# Patient Record
Sex: Female | Born: 1975 | Race: Black or African American | Hispanic: No | Marital: Single | Smoking: Never smoker
Health system: Southern US, Community
[De-identification: ages and names within clinical notes are randomized; demographics above are authoritative.]

## PROBLEM LIST (undated history)

## (undated) HISTORY — PX: TUBAL LIGATION: SHX77

---

## 2014-04-21 ENCOUNTER — Encounter (HOSPITAL_COMMUNITY): Payer: Self-pay | Admitting: *Deleted

## 2014-04-21 ENCOUNTER — Emergency Department (HOSPITAL_COMMUNITY)
Admission: EM | Admit: 2014-04-21 | Discharge: 2014-04-21 | Disposition: A | Discharge status: Home / Self Care | Payer: Self-pay | Attending: Emergency Medicine | Admitting: Emergency Medicine

## 2014-04-21 DIAGNOSIS — J069 Acute upper respiratory infection, unspecified: Secondary | ICD-10-CM | POA: Insufficient documentation

## 2014-04-21 DIAGNOSIS — H578 Other specified disorders of eye and adnexa: Secondary | ICD-10-CM | POA: Insufficient documentation

## 2014-04-21 DIAGNOSIS — H10021 Other mucopurulent conjunctivitis, right eye: Secondary | ICD-10-CM

## 2014-04-21 MED ORDER — ERYTHROMYCIN 5 MG/GM OP OINT: TOPICAL_OINTMENT | OPHTHALMIC | Status: DC

## 2014-04-21 NOTE — ED Notes (Signed)
Pt is here with irritation to right eye.  Pt reports continuous head cold and reports green sputum

## 2014-04-21 NOTE — ED Provider Notes (Signed)
CSN: [id number]     Arrival date & time 04/21/14  4098 History  This chart was scribed for non-physician practitioner, Mellody Drown, PA-C , working with Vida Roller, MD by Charline Bills, ED Scribe. This patient was seen in room TR05C/TR05C and the patient's care was started at 9:07 AM.   Chief Complaint  Patient presents with  . Eye Drainage  . Nasal Congestion   HPI Comments: Cassandra Yates is a 39 y.o. female who presents to the Emergency Department complaining of R eye discharge onset this morning. Pt states that she noted eye crusting, eye itching and redness this morning upon waking. She reports associated sore throat, congestion, cough, subjective fever since last week. Pt wears glasses. No known allergies.   The history is provided by the patient. No language interpreter was used.   History reviewed. No pertinent past medical history. Past Surgical History  Procedure Laterality Date  . Tubal ligation    . Cesarean section     No family history on file. History  Substance Use Topics  . Smoking status: Never Smoker   . Smokeless tobacco: Not on file  . Alcohol Use: No   OB History    No data available     Review of Systems  Constitutional: Negative for fever and chills.  HENT: Positive for congestion and sore throat. Negative for ear pain.   Eyes: Positive for discharge, redness and itching.  Respiratory: Positive for cough.    Allergies  Review of patient's allergies indicates no known allergies.  Home Medications   Prior to Admission medications   Not on File   Triage Vitals: BP 143/95 mmHg  Pulse 80  Temp(Src) 97.9 F (36.6 C) (Oral)  Resp 18  SpO2 100%  LMP 04/07/2014 Physical Exam  Constitutional: She is oriented to person, place, and time. She appears well-developed and well-nourished.  Non-toxic appearance. She does not have a sickly appearance. No distress.  HENT:  Head: Normocephalic and atraumatic.  Nose: Rhinorrhea present.  Mouth/Throat:  Uvula is midline and oropharynx is clear and moist. No trismus in the jaw. No oropharyngeal exudate, posterior oropharyngeal erythema or tonsillar abscesses.  Eyes: EOM are normal. Pupils are equal, round, and reactive to light. Right eye exhibits discharge. Right eye exhibits no chemosis, no exudate and no hordeolum. Left eye exhibits no chemosis, no discharge, no exudate and no hordeolum. No foreign body present in the left eye. Right conjunctiva is injected.  Minimal amount of clear drainage.  Neck: Neck supple.  Cardiovascular: Normal rate, regular rhythm and normal heart sounds.   Pulmonary/Chest: Effort normal and breath sounds normal. No respiratory distress. She has no decreased breath sounds. She has no wheezes. She has no rhonchi. She has no rales.  Musculoskeletal: Normal range of motion.  Lymphadenopathy:       Head (right side): No tonsillar adenopathy present.       Head (left side): No tonsillar adenopathy present.  Neurological: She is alert and oriented to person, place, and time.  Skin: Skin is warm and dry.  Psychiatric: She has a normal mood and affect. Her behavior is normal.  Nursing note and vitals reviewed.  ED Course  Procedures (including critical care time) DIAGNOSTIC STUDIES: Oxygen Saturation is 100% on RA, normal by my interpretation.    COORDINATION OF CARE: 9:12 AM-Discussed treatment with pt at bedside and pt agreed to plan.   Labs Review Labs Reviewed - No data to display  Imaging Review No results found.  EKG Interpretation None      MDM   Final diagnoses:  Pink eye disease of right eye  URI (upper respiratory infection)   Patient with upper respiratory infection and pinkeye plan to treat with antibiotics, follow-up with eye specialist as needed. Mucinex DM for upper respiratory symptoms.  Work note provided.  Meds given in ED:  Medications - No data to display  Discharge Medication List as of 04/21/2014  9:15 AM    START taking these  medications   Details  erythromycin ophthalmic ointment Place a 1/2 inch ribbon of ointment into the lower eyelid. 4 times a day for 5 days, Print       I personally performed the services described in this documentation, which was scribed in my presence. The recorded information has been reviewed and is accurate.    Mellody DrownLauren Aristidis Talerico, PA-C 04/22/14 2346  Flint MelterElliott L [place], MD 04/22/14 [id number]

## 2014-04-21 NOTE — Discharge Instructions (Signed)
Call for a follow up appointment with a Family or Primary Care Provider.  °Return if Symptoms worsen.   °Take medication as prescribed.  ° ° °Emergency Department Resource Guide °1) Find a Doctor and Pay Out of Pocket °Although you won't have to find out who is covered by your insurance plan, it is a good idea to ask around and get recommendations. You will then need to call the office and see if the doctor you have chosen will accept you as a new patient and what types of options they offer for patients who are self-pay. Some doctors offer discounts or will set up payment plans for their patients who do not have insurance, but you will need to ask so you aren't surprised when you get to your appointment. ° °2) Contact Your Local Health Department °Not all health departments have doctors that can see patients for sick visits, but many do, so it is worth a call to see if yours does. If you don't know where your local health department is, you can check in your phone book. The CDC also has a tool to help you locate your state's health department, and many state websites also have listings of all of their local health departments. ° °3) Find a Walk-in Clinic °If your illness is not likely to be very severe or complicated, you may want to try a walk in clinic. These are popping up all over the country in pharmacies, drugstores, and shopping centers. They're usually staffed by nurse practitioners or physician assistants that have been trained to treat common illnesses and complaints. They're usually fairly quick and inexpensive. However, if you have serious medical issues or chronic medical problems, these are probably not your best option. ° °No Primary Care Doctor: °- Call [facility] at  832-8000 - they can help you locate a primary care doctor that  accepts your insurance, provides certain services, etc. °- Physician Referral Service- 1-800-533-3463 ° °Chronic Pain Problems: °Organization         Address  Phone    Notes  °[facility]  (336) 297-2271 Patients need to be referred by their primary care doctor.  ° °Medication Assistance: °Organization         Address  Phone   Notes  °[place] [facility] [address] °[place], [place] [address] (336) 641-8030 --Must be a resident of [place] °-- Must have NO insurance coverage whatsoever (no Medicaid/ Medicare, etc.) °-- The pt. MUST have a primary care doctor that directs their care regularly and follows them in the community °  °[facility]  (866) 331-1348   °[employer]  (888) 892-1162   ° °Agencies that provide inexpensive medical care: °Organization         Address  Phone   Notes  °[facility]  (336) 832-8035   °[facility]    (336) 832-7272   °[facility] [address] °[place], [place] [address] (336) 832-4777   °[facility] [address], °[place] (336) 271-4999   °[facility]    (336) 373-0678   °[facility]    (336) 272-1050   °[facility] ° [address], [place] Phone:  (336) 832-4444, Fax:  (336) 832-4440 Hours of Operation:  9 am - 6 pm, M-F.  Also accepts Medicaid/Medicare and self-pay.  °[facility] ° [address],    Phone: (336) 832-3150, Fax: (336) 832-3151. Hours of Operation:  8:30 am - 5:30 pm, M-F.  Also accepts Medicaid and self-pay.  °[facility] [place] [address], [place] Phone: (336) 878-6027   °[facility] [address], [place], [place] (336)723-1848, Ext. 123 Mondays & Thursdays: 7-9 AM.  First 15 patients are seen on a first come, first serve basis. °  ° °Medicaid-accepting [place] Providers: ° °Organization         Address  Phone   Notes  °[facility] [address], [place] (336) 641-2100 Also accepts self-pay patients.  °[facility]  [address], [place] ° (336) 856-9996   °[facility] [address], [place] (336) 288-8857   °[facility] [address] [place] [address], [place] (336) 299-7000   °Veita Bland [address], [place]  ° (336) 373-1557 Only accepts [place] Access Medicaid patients after they have their name applied to their card.  ° °Self-Pay (no insurance) in [place]: ° °Organization         Address  Phone   Notes  °Sickle Cell Patients, [facility] [address], [place] (336) 832-1970   °[facility] [address], [place] (336) 832-4400   °[facility] ° [address], [place] (336) 992-4800   °[facility]/Dr. Osei-Bonsu ° [address] [place] [address], [place] or [address], [place] (336) 841-8500 Phone number for both [place] and [place] locations is the same.  °[facility] [address], [place] (336) 299-0000   °[facility] [address] [place] [address], [place] or [address] (336) 852-7530 °(336) 878-2260   °[facility] [address], [place] (336) 350-1642, phone; (336) 294-5005, fax Sees patients 1st and 3rd Saturday of every month.  Must not qualify for public or private insurance (i.e. Medicaid, Medicare, [place] Health Choice, Veterans' Benefits) • Household income should be no more than 200% of the poverty level •The clinic cannot treat you if you are pregnant or think you are pregnant • Sexually transmitted diseases are not treated at the clinic.  ° ° °Dental Care: °Organization         Address  Phone  Notes  °[facility] [address],  (336) 641-6152 Accepts children up to age 21 who are enrolled in Medicaid or [place] Health Choice; pregnant women with a Medicaid card; and children who have  applied for Medicaid or [place] Health Choice, but were declined, whose parents can pay a reduced fee at time of service.  °[facility] [place]  [address], [place] (336) 641-7733 Accepts children up to age 21 who are enrolled in Medicaid or  Health Choice; pregnant women with a Medicaid card; and children who have applied for Medicaid or  Health Choice, but were declined, whose parents can pay a reduced fee at time of service.  °[facility] ° [address],  (336) 641-4533 Patients are seen by appointment only. Walk-ins are not accepted. [facility] will see patients 18 years of age and older. °Monday - Tuesday (8am-5pm) °Most Wednesdays (8:30-5pm) °$30 per visit, cash only  °[facility] ° [address], [place] (336) 641-4533 Patients are seen by appointment only. Walk-ins are not accepted. [facility] will see patients 18 years of age and older. °One Wednesday Evening (Monthly: Volunteer Based).  $30 per visit, cash only  °[facility]  (919) 537-3737 for adults; Children under age 4, call Graduate Pediatric Dentistry at ([phone]) [phone]. Children aged 4-14, please call (919) [phone] to request a pediatric application. ° Dental services are provided in all areas of dental care including fillings, crowns and bridges, complete and partial dentures, implants, gum treatment, root canals, and extractions. Preventive care is also provided. Treatment is provided to both adults and children. °Patients are selected via a lottery and there is often a waiting list. °  °[facility] [address], °[place] ° (336) 763-8833 [email or web address] °  °[facility] [address], [place], [place] (336)723-1848, Ext. 123 Second and Fourth Thursday of each month, opens at 6:30 AM; Clinic ends at 9 AM.  Patients are seen on a first-come first-served basis, and a  limited number are seen during each clinic.  ° °[facility] ° [address], [place], [place] (336) 723-7904   Eligibility Requirements °You must have lived in [place], [place], or [place] counties for at least the last three months. °  You cannot be eligible for state or federal sponsored healthcare insurance, including Veterans Administration, Medicaid, or Medicare. °  You generally cannot be eligible for healthcare insurance through your employer.  °  How to apply: °Eligibility screenings are held every Tuesday and Wednesday afternoon from 1:00 pm until 4:00 pm. You do not need an appointment for the interview!  °[facility] [address], [place], [place] 336-631-2330   °[facility]  336-342-8273   °[facility]  336-703-3100   °[facility]  336-570-6415   ° °Behavioral Health Resources in the Community: °Intensive Outpatient Programs °Organization         Address  Phone  Notes  °[facility] [address], [place], [place] 336-878-6098   °[facility] [address], [place], [place] 336-832-9800   °[facility] [address], [place], [place] ° 336-882-2125   °[facility] [address],  °[place], [place] 1-800-853-5163 or 336-641-4981   °Substance Abuse Resources °Organization         Address  Phone  Notes  °[facility]  336-882-2125   °[facility]  336-784-9470   °[facility]  336-285-9073   °[facility]  336-845-3988   °Residential & Outpatient Substance Abuse Program  1-800-659-3381   °Psychological Services °Organization         Address  Phone  Notes  °[facility]  336- 832-9600   °[facility]  336- 378-7881   °[facility] [address], [place] 1-800-853-5163 or 336-641-4981   ° °Mobile Crisis Teams °Organization          Address  Phone  Notes  °[facility], Mobile Crisis Care Unit  1-877-626-1772   °Assertive °[facility] ° [address] [place], [place] 336-834-9664   °Sharon DeEsch [address] °[place] 336-554-5454   ° °Self-Help/Support Groups °Organization         Address  Phone             Notes  °[facility]  - variety of   support groups  336- 373-1402 Call for more information  °Narcotics Anonymous (NA), [facility] [address], °[place]  2 meetings at this location  ° °Residential Treatment Programs °Organization         Address  Phone  Notes  °[facility] [address],    °[place]  1-866-801-8205   °[facility] ° [address], [place], [place] 704-293-8524   °[facility] [address], [place] 336-845-3988 Admissions: 8am-3pm M-F  °[facility] [address]    °[place], [place] 336-841-1104   °[facility] [address], [place], [place] 336-379-7146   °[facility] [address]  °[place], [place] 336-285-9073   °[facility] Intensive Outpatient [address], [place], [place] 336-852-3033   °[facility] [address]  °[place], [place] 1-877-615-2722 or 336-784-9470   °[facility] [address] [place], [place] 336-227-7417 Accepts Medicaid  °[facility] [address]  °[place] 1-800-659-3381 Substance Abuse/Addiction Treatment  ° °[place] Behavioral Health Resources °Organization         Address  Phone  Notes  °[facility]  (888) 581-9988   °Julie Brannon, PhD [address] [place], [place]   (336) 349-5553 or (336) 951-0000   °[facility]   [address] °[place], [place] (336) 349-4454   °[facility] [address], [place], [place] (336) 342-8316 Insurance/Medicaid/sponsorship  through [facility]  °[facility] [address]                                    [place], [place] (336) 342-8316 Therapy/tele-psych/case  °[facility] [address]  ° [place], [place] (336) 349-2233    °Dr. Arfeen  (336) 349-4544   °[facility] [place]  [employer] [place] [facility] 1) [address], [place] °2) [address], [place] °3)  [address], [place] (336) 349-3220 °(336) 342-7768 ° °(336) 342-8140   °[place] [facility] (336) 342-1394 or (336) 342-3537 (After Hours)    ° °

## 2015-05-10 ENCOUNTER — Emergency Department (HOSPITAL_COMMUNITY): Payer: No Typology Code available for payment source

## 2015-05-10 ENCOUNTER — Emergency Department (HOSPITAL_COMMUNITY)
Admission: EM | Admit: 2015-05-10 | Discharge: 2015-05-10 | Disposition: A | Discharge status: Home / Self Care | Payer: No Typology Code available for payment source | Attending: Emergency Medicine | Admitting: Emergency Medicine

## 2015-05-10 ENCOUNTER — Encounter (HOSPITAL_COMMUNITY): Payer: Self-pay | Admitting: *Deleted

## 2015-05-10 DIAGNOSIS — M545 Low back pain, unspecified: Secondary | ICD-10-CM

## 2015-05-10 DIAGNOSIS — S3992XA Unspecified injury of lower back, initial encounter: Secondary | ICD-10-CM | POA: Diagnosis not present

## 2015-05-10 DIAGNOSIS — S29001A Unspecified injury of muscle and tendon of front wall of thorax, initial encounter: Secondary | ICD-10-CM | POA: Insufficient documentation

## 2015-05-10 DIAGNOSIS — S199XXA Unspecified injury of neck, initial encounter: Secondary | ICD-10-CM | POA: Diagnosis not present

## 2015-05-10 DIAGNOSIS — Y998 Other external cause status: Secondary | ICD-10-CM | POA: Diagnosis not present

## 2015-05-10 DIAGNOSIS — Y9389 Activity, other specified: Secondary | ICD-10-CM | POA: Diagnosis not present

## 2015-05-10 DIAGNOSIS — M542 Cervicalgia: Secondary | ICD-10-CM

## 2015-05-10 DIAGNOSIS — Y92410 Unspecified street and highway as the place of occurrence of the external cause: Secondary | ICD-10-CM | POA: Diagnosis not present

## 2015-05-10 DIAGNOSIS — S3991XA Unspecified injury of abdomen, initial encounter: Secondary | ICD-10-CM | POA: Insufficient documentation

## 2015-05-10 MED ORDER — IBUPROFEN 800 MG PO TABS: 800.0000 mg | ORAL_TABLET | Freq: Three times a day (TID) | ORAL | Status: DC

## 2015-05-10 MED ORDER — IBUPROFEN 800 MG PO TABS
800.0000 mg | ORAL_TABLET | Freq: Once | ORAL | Status: AC
  Administered 2015-05-10: 800 mg via ORAL
  Filled 2015-05-10: qty 1

## 2015-05-10 MED ORDER — METHOCARBAMOL 500 MG PO TABS: 500.0000 mg | ORAL_TABLET | Freq: Two times a day (BID) | ORAL | Status: DC

## 2015-05-10 MED ORDER — TETANUS-DIPHTH-ACELL PERTUSSIS 5-2.5-18.5 LF-MCG/0.5 IM SUSP: 0.5000 mL | Freq: Once | INTRAMUSCULAR | Status: DC

## 2015-05-10 MED ORDER — METHOCARBAMOL 500 MG PO TABS
500.0000 mg | ORAL_TABLET | Freq: Once | ORAL | Status: AC
  Administered 2015-05-10: 500 mg via ORAL
  Filled 2015-05-10: qty 1
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
## 2015-05-10 NOTE — Discharge Instructions (Signed)
1. Medications: robaxin, ibuprofen, usual home medications °2. Treatment: rest, drink plenty of fluids, gentle stretching as discussed, alternate ice and heat °3. Follow Up: Please followup with your primary doctor in 3 days for discussion of your diagnoses and further evaluation after today's visit; if you do not have a primary care doctor use the resource guide provided to find one;  Return to the ER for worsening back pain, difficulty walking, loss of bowel or bladder control or other concerning symptoms ° ° ° °Back Exercises °The following exercises strengthen the muscles that help to support the back. They also help to keep the lower back flexible. Doing these exercises can help to prevent back pain or lessen existing pain. °If you have back pain or discomfort, try doing these exercises 2-3 times each day or as told by your health care provider. When the pain goes away, do them once each day, but increase the number of times that you repeat the steps for each exercise (do more repetitions). If you do not have back pain or discomfort, do these exercises once each day or as told by your health care provider. °EXERCISES °Single Knee to Chest °Repeat these steps 3-5 times for each leg: °1. Lie on your back on a firm bed or the floor with your legs extended. °2. Bring one knee to your chest. Your other leg should stay extended and in contact with the floor. °3. Hold your knee in place by grabbing your knee or thigh. °4. Pull on your knee until you feel a gentle stretch in your lower back. °5. Hold the stretch for 10-30 seconds. °6. Slowly release and straighten your leg. °Pelvic Tilt °Repeat these steps 5-10 times: °1. Lie on your back on a firm bed or the floor with your legs extended. °2. Bend your knees so they are pointing toward the ceiling and your feet are flat on the floor. °3. Tighten your lower abdominal muscles to press your lower back against the floor. This motion will tilt your pelvis so your tailbone  points up toward the ceiling instead of pointing to your feet or the floor. °4. With gentle tension and even breathing, hold this position for 5-10 seconds. °Cat-Cow °Repeat these steps until your lower back becomes more flexible: °1. Get into a hands-and-knees position on a firm surface. Keep your hands under your shoulders, and keep your knees under your hips. You may place padding under your knees for comfort. °2. Let your head hang down, and point your tailbone toward the floor so your lower back becomes rounded like the back of a cat. °3. Hold this position for 5 seconds. °4. Slowly lift your head and point your tailbone up toward the ceiling so your back forms a sagging arch like the back of a cow. °5. Hold this position for 5 seconds. °Press-Ups °Repeat these steps 5-10 times: °1. Lie on your abdomen (face-down) on the floor. °2. Place your palms near your head, about shoulder-width apart. °3. While you keep your back as relaxed as possible and keep your hips on the floor, slowly straighten your arms to raise the top half of your body and lift your shoulders. Do not use your back muscles to raise your upper torso. You may adjust the placement of your hands to make yourself more comfortable. °4. Hold this position for 5 seconds while you keep your back relaxed. °5. Slowly return to lying flat on the floor. °Bridges °Repeat these steps 10 times: °1. Lie on your back   on a firm surface. °2. Bend your knees so they are pointing toward the ceiling and your feet are flat on the floor. °3. Tighten your buttocks muscles and lift your buttocks off of the floor until your waist is at almost the same height as your knees. You should feel the muscles working in your buttocks and the back of your thighs. If you do not feel these muscles, slide your feet 1-2 inches farther away from your buttocks. °4. Hold this position for 3-5 seconds. °5. Slowly lower your hips to the starting position, and allow your buttocks muscles to  relax completely. °If this exercise is too easy, try doing it with your arms crossed over your chest. °Abdominal Crunches °Repeat these steps 5-10 times: °1. Lie on your back on a firm bed or the floor with your legs extended. °2. Bend your knees so they are pointing toward the ceiling and your feet are flat on the floor. °3. Cross your arms over your chest. °4. Tip your chin slightly toward your chest without bending your neck. °5. Tighten your abdominal muscles and slowly raise your trunk (torso) high enough to lift your shoulder blades a tiny bit off of the floor. Avoid raising your torso higher than that, because it can put too much stress on your low back and it does not help to strengthen your abdominal muscles. °6. Slowly return to your starting position. °Back Lifts °Repeat these steps 5-10 times: °1. Lie on your abdomen (face-down) with your arms at your sides, and rest your forehead on the floor. °2. Tighten the muscles in your legs and your buttocks. °3. Slowly lift your chest off of the floor while you keep your hips pressed to the floor. Keep the back of your head in line with the curve in your back. Your eyes should be looking at the floor. °4. Hold this position for 3-5 seconds. °5. Slowly return to your starting position. °SEEK MEDICAL CARE IF: °· Your back pain or discomfort gets much worse when you do an exercise. °· Your back pain or discomfort does not lessen within 2 hours after you exercise. °If you have any of these problems, stop doing these exercises right away. Do not do them again unless your health care provider says that you can. °SEEK IMMEDIATE MEDICAL CARE IF: °· You develop sudden, severe back pain. If this happens, stop doing the exercises right away. Do not do them again unless your health care provider says that you can. °  °This information is not intended to replace advice given to you by your health care provider. Make sure you discuss any questions you have with your health  care provider. °  °Document Released: 04/28/2004 Document Revised: 12/10/2014 Document Reviewed: 05/15/2014 °Elsevier Interactive Patient Education ©2016 [employer] Inc[employer] ° ° ° °Emergency Department Resource Guide °1) Find a Doctor and Pay Out of Pocket °Although you won't have to find out who is covered by your insurance plan, it is a good idea to ask around and get recommendations. You will then need to call the office and see if the doctor you have chosen will accept you as a new patient and what types of options they offer for patients who are self-pay. Some doctors offer discounts or will set up payment plans for their patients who do not have insurance, but you will need to ask so you aren't surprised when you get to your appointment. ° °2) Contact Your Local Health Department °Not all health departments have doctors   that can see patients for sick visits, but many do, so it is worth a call to see if yours does. If you don't know where your local health department is, you can check in your phone book. The CDC also has a tool to help you locate your state's health department, and many state websites also have listings of all of their local health departments. ° °3) Find a Walk-in Clinic °If your illness is not likely to be very severe or complicated, you may want to try a walk in clinic. These are popping up all over the country in pharmacies, drugstores, and shopping centers. They're usually staffed by nurse practitioners or physician assistants that have been trained to treat common illnesses and complaints. They're usually fairly quick and inexpensive. However, if you have serious medical issues or chronic medical problems, these are probably not your best option. ° °No Primary Care Doctor: °- Call [facility] at  832-8000 - they can help you locate a primary care doctor that  accepts your insurance, provides certain services, etc. °- Physician Referral Service- 1-800-533-3463 ° °Chronic Pain  Problems: °Organization         Address  Phone   Notes  °[facility]  (336) 297-2271 Patients need to be referred by their primary care doctor.  ° °Medication Assistance: °Organization         Address  Phone   Notes  °[place] [facility] [address] °[place], [place] [address] (336) 641-8030 --Must be a resident of [place] °-- Must have NO insurance coverage whatsoever (no Medicaid/ Medicare, etc.) °-- The pt. MUST have a primary care doctor that directs their care regularly and follows them in the community °  °[facility]  (866) 331-1348   °[employer]  (888) 892-1162   ° °Agencies that provide inexpensive medical care: °Organization         Address  Phone   Notes  °[facility]  (336) 832-8035   °[facility]    (336) 832-7272   °[facility] [address] °[place], [place] [address] (336) 832-4777   °[facility] [address], °[place] (336) 271-4999   °[facility]    (336) 373-0678   °[facility]    (336) 272-1050   °[facility] ° [address], [place] Phone:  (336) 832-4444, Fax:  (336) 832-4440 Hours of Operation:  9 am - 6 pm, M-F.  Also accepts Medicaid/Medicare and self-pay.  °[facility] ° [address], [place] Phone: (336) [phone], Fax: (336) [phone]. Hours of Operation:  8:30 am - 5:30 pm, M-F.  Also accepts Medicaid and self-pay.  °[facility] [place] [address], [place] Phone: (336) 878-6027   °[facility] [address], [place], [place] (336)723-1848, Ext. 123 Mondays & Thursdays: 7-9 AM.  First 15 patients are seen on a first come, first serve basis. °  ° °Medicaid-accepting [place] Providers: ° °Organization         Address  Phone   Notes  °[facility] [address], [place] (336) 641-2100 Also  accepts self-pay patients.  °[facility] [address], [place] ° (336) 856-9996   °[facility] [address],  (336) 288-8857   °[facility] [address] [place] [address], [place] (336) 299-7000   °Veita Bland [address], [place]  ° (336) 373-1557 Only accepts [place] Access Medicaid patients after they have their name applied to their card.  ° °Self-Pay (no insurance) in [place]: ° °Organization         Address  Phone   Notes  °Sickle Cell Patients, [facility] [address], [place] (336) 832-1970   °[facility] [address], [place] (336) 832-4400   °[facility] ° [address], [place] (336) 992-4800   °[facility]/Dr. Osei-Bonsu ° [address] [place] [address], [place] or [address], [place] (336) [phone] Phone number for both [place] and [place] locations is the same.  °[facility] [address], [place] (336) 299-0000   °[facility] [address] [place] [address], [place] or [address] (336) 852-7530 °(336) 878-2260   °[facility] [address], [place] (336) 350-1642, phone; (336) 294-5005, fax Sees patients 1st and 3rd Saturday of every month.  Must not qualify for public or private insurance (i.e. Medicaid, Medicare, [place] Health Choice, Veterans' Benefits) • Household income should be no more than 200% of the poverty level •The clinic cannot treat you if you are pregnant or think you are pregnant • Sexually transmitted diseases are not treated at the clinic.  ° ° °Dental Care: °Organization         Address  Phone  Notes  °[facility] [address], [place] (336) 641-6152 Accepts children up to age 21 who are enrolled in Medicaid or [place] Health Choice; pregnant  women with a Medicaid card; and children who have applied for Medicaid or [place] Health Choice, but were declined, whose parents can pay a reduced fee at time of service.  °[facility] [place]  [address], [place] (336) 641-7733 Accepts children up to age 21 who are enrolled in Medicaid or [place] Health Choice; pregnant women with a Medicaid card; and children who have applied for Medicaid or  Health Choice, but were declined, whose parents can pay a reduced fee at time of service.  °[facility] ° [address], [place] (336) 641-4533 Patients are seen by appointment only. Walk-ins are not accepted. [facility] will see patients 18 years of age and older. °Monday - Tuesday (8am-5pm) °Most Wednesdays (8:30-5pm) °$30 per visit, cash only  °[facility] ° [address], [place] (336) 641-4533 Patients are seen by appointment only. Walk-ins are not accepted. [facility] will see patients 18 years of age and older. °One Wednesday Evening (Monthly: Volunteer Based).  $30 per visit, cash only  °[facility]  (919) [phone] for adults; Children under age 4, call Graduate Pediatric Dentistry at ([phone]) [phone]. Children aged 4-14, please call ([phone]) [phone] to request a pediatric application. ° Dental services are provided in all areas of dental care including fillings, crowns and bridges, complete and partial dentures, implants, gum treatment, root canals, and extractions. Preventive care is also provided. Treatment is provided to both adults and children. °Patients are selected via a lottery and there is often a waiting list. °  °[facility] [address], °[place] ° (336) 763-8833 [email or web address] °  °[facility] [address], [place], [place] (336)723-1848, Ext. 123 Second and Fourth Thursday of each month, opens at 6:30 AM; Clinic ends at 9 AM.  Patients are  seen on a first-come first-served basis, and a limited number are seen during each clinic.  ° °[facility] ° [address], [place], [place] (336) 723-7904   Eligibility Requirements °You must have lived in [place], [place], or [place] counties for at least the last three months. °  You cannot be eligible for state or federal sponsored healthcare insurance, including Veterans Administration, Medicaid, or Medicare. °  You generally cannot be eligible for healthcare insurance through your employer.  °  How to apply: °Eligibility screenings are held every Tuesday and Wednesday afternoon from 1:00 pm until 4:00 pm. You do not need an appointment for the interview!  °[facility] [address], [place], [place] 336-631-2330   °[facility]  336-342-8273   °[facility]  336-703-3100   °[facility]  336-570-6415   ° °Behavioral Health Resources in the Community: °Intensive Outpatient Programs °Organization         Address  Phone  Notes  °[facility] [address], [place], [place] 336-878-6098   °[facility] [address], [place], [place] 336-832-9800   °[facility] [address], [place], [place] ° 336-882-2125   °[facility] [address],  °[place], [place] 1-800-853-5163 or 336-641-4981   °Substance Abuse Resources °Organization         Address  Phone  Notes  °[facility]  336-882-2125   °[facility]  336-784-9470   °[facility]  336-285-9073   °[facility]  336-845-3988   °Residential & Outpatient Substance Abuse Program  1-800-659-3381   °Psychological Services °Organization         Address  Phone  Notes  °[facility]  336- 832-9600   °[facility]  336- 378-7881   °[facility] [address], [place] 1-800-853-5163 or 336-641-4981   ° °Mobile Crisis  Teams °Organization         Address  Phone  Notes  °[facility], Mobile Crisis Care Unit  1-877-626-1772   °Assertive °[facility] ° [address] [place], [place] 336-834-9664   °Sharon DeEsch [address] °[place] 336-554-5454   ° °Self-Help/Support Groups °Organization         Address  Phone             Notes  °[facility] [place] - variety of support groups  336- 373-1402 Call for more information  °Narcotics Anonymous (NA), [facility] [address], °[place]  2 meetings at this location  ° °Residential Treatment Programs °Organization         Address  Phone  Notes  °[facility] [address],    °[place]  1-866-801-8205   °[facility] ° [address], [place], [place] 704-293-8524   °[facility] [address], [place] 336-845-3988 Admissions: 8am-3pm M-F  °[facility] [address]    °[place], [place] 336-841-1104   °[facility] [address], [place], [place] 336-379-7146   °[facility] [address]  °[place], [place] 336-285-9073   °[facility] Intensive Outpatient [address], [place],  336-852-3033   °  [facility] [address]  °[place], [place] 1-877-615-2722 or 336-784-9470   °[facility] [address] [place], [place] 336-227-7417 Accepts Medicaid  °[facility] [address]  °[place] 1-800-659-3381 Substance Abuse/Addiction Treatment  ° °[place] Behavioral Health Resources °Organization         Address  Phone  Notes  °[facility]  (888) 581-9988   °Julie Brannon, PhD [address] [place], [place]   (336) 349-5553 or (336) 951-0000   °[facility]   [address] °[place], [place] (336) 349-4454   °[facility] [address], [place], [place] (336) 342-8316  Insurance/Medicaid/sponsorship through [facility]  °[facility] [address]                                    [place], [place] (336) 342-8316 Therapy/tele-psych/case  °[facility] [address]  ° [place], [place] (336) 349-2233    °Dr. Arfeen  (336) 349-4544   °[facility] [place]  [employer] [place] [facility] 1) [address], [place] °2) [address], [place] °3)  [address], [place] (336) 349-3220 °(336) 342-7768 ° °(336) 342-8140   °[place] [facility] (336) 342-1394 or (336) 342-3537 (After Hours)    ° ° ° ° °

## 2015-05-10 NOTE — ED Notes (Signed)
Patient transported to X-ray 
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
## 2017-05-16 DIAGNOSIS — Z124 Encounter for screening for malignant neoplasm of cervix: Secondary | ICD-10-CM | POA: Diagnosis not present

## 2017-05-16 DIAGNOSIS — Z83438 Family history of other disorder of lipoprotein metabolism and other lipidemia: Secondary | ICD-10-CM | POA: Diagnosis not present

## 2017-05-16 DIAGNOSIS — Z8349 Family history of other endocrine, nutritional and metabolic diseases: Secondary | ICD-10-CM | POA: Diagnosis not present

## 2017-05-16 DIAGNOSIS — Z113 Encounter for screening for infections with a predominantly sexual mode of transmission: Secondary | ICD-10-CM | POA: Diagnosis not present

## 2017-05-16 DIAGNOSIS — Z01419 Encounter for gynecological examination (general) (routine) without abnormal findings: Secondary | ICD-10-CM | POA: Diagnosis not present

## 2018-03-26 DIAGNOSIS — B373 Candidiasis of vulva and vagina: Secondary | ICD-10-CM | POA: Diagnosis not present

## 2018-03-26 DIAGNOSIS — D251 Intramural leiomyoma of uterus: Secondary | ICD-10-CM | POA: Diagnosis not present

## 2018-04-14 ENCOUNTER — Encounter: Payer: Self-pay | Admitting: Osteopathic Medicine

## 2018-04-14 ENCOUNTER — Other Ambulatory Visit: Payer: Self-pay

## 2018-04-14 ENCOUNTER — Ambulatory Visit (INDEPENDENT_AMBULATORY_CARE_PROVIDER_SITE_OTHER): Payer: BLUE CROSS/BLUE SHIELD | Admitting: Osteopathic Medicine

## 2018-04-14 VITALS — BP 145/88 | HR 77 | Temp 98.5°F | Ht 66.0 in | Wt 198.6 lb

## 2018-04-14 DIAGNOSIS — R030 Elevated blood-pressure reading, without diagnosis of hypertension: Secondary | ICD-10-CM

## 2018-04-14 DIAGNOSIS — J069 Acute upper respiratory infection, unspecified: Secondary | ICD-10-CM | POA: Diagnosis not present

## 2018-04-14 DIAGNOSIS — B9789 Other viral agents as the cause of diseases classified elsewhere: Secondary | ICD-10-CM

## 2018-04-14 NOTE — Progress Notes (Signed)
HPI: Cassandra Yates is a 43 y.o. female who  has no past medical history on file.  she presents to [facility] today, 04/14/18,  for chief complaint of: Chief Complaint  Patient presents with  . Cough    being of this week   . Generalized Body Aches    . Context: thinks got sick form someone at work, daughter was also sick w/ similar symptoms.   . Location/Quality: chest, sinuses. Congestion and cough.  . Severity: getting better today, had to miss work past 3 days and needs a note.  . Duration: 6 days total        Past medical, surgical, social and family history reviewed:  There are no active problems to display for this patient.   Past Surgical History:  Procedure Laterality Date  . CESAREAN SECTION    . TUBAL LIGATION      Social History   Tobacco Use  . Smoking status: Never Smoker  . Smokeless tobacco: Never Used  Substance Use Topics  . Alcohol use: No    No family history on file.   Current medication list and allergy/intolerance information reviewed:    No current outpatient medications on file.   No current facility-administered medications for this visit.     No Known Allergies    Review of Systems:  Constitutional:  No  fever, no chills, +recent illness, No unintentional weight changes. No significant fatigue.   HEENT: No  headache, no vision change, no hearing change, No sore throat, +sinus pressure  Cardiac: No  chest pain, No  pressure, No palpitations  Respiratory:  No  shortness of breath. +Cough  Gastrointestinal: No  abdominal pain, No  nausea, No  vomiting,  No  blood in stool, No  diarrhea  Musculoskeletal: No new myalgia/arthralgia  Skin: No  Rash  Neurologic: No  weakness, No  dizziness  Exam:  BP (!) 145/88 (BP Location: Right Arm, Patient Position: Sitting, Cuff Size: Normal)   Pulse 77   Temp 98.5 F (36.9 C) (Oral)   Ht 5\' 6"  (1.676 m)   Wt 198 lb 9.6 oz (90.1 kg)   SpO2 97%   BMI 32.05  kg/m   Constitutional: VS see above. General Appearance: alert, well-developed, well-nourished, NAD  Eyes: Normal lids and conjunctive, non-icteric sclera  Ears, Nose, Mouth, Throat: MMM, Normal external inspection ears/nares/mouth/lips/gums. TM normal bilaterally. Pharynx/tonsils no erythema, no exudate. Nasal mucosa normal.   Neck: No masses, trachea midline. No tenderness/mass appreciated. No lymphadenopathy  Respiratory: Normal respiratory effort. no wheeze, no rhonchi, no rales  Cardiovascular: S1/S2 normal, no murmur, no rub/gallop auscultated. RRR. No lower extremity edema.   Musculoskeletal: Gait normal.   Neurological: Normal balance/coordination. No tremor.   Skin: warm, dry, intact.   Psychiatric: Normal judgment/insight. Normal mood and affect.       ASSESSMENT/PLAN:   Viral URI with cough - overall improving, would expect resolution. Work note provided...   Borderline blood pressure - would recommend establish here with primary doctor to recheck (I am a prn provider at this location)     Patient Instructions   Medications & Home Remedies for Upper Respiratory Illness   Note: the following list assumes no pregnancy, normal liver & kidney function and no other drug interactions. Dr. Lyn Hollingshead has highlighted medications which are safe for you to use, but these may not be appropriate for everyone. Always ask a pharmacist or qualified medical provider if you have any questions!  Aches/Pains, Fever, Headache OTC Acetaminophen (Tylenol) 500 mg tablets - take max 2 tablets (1000 mg) every 6 hours (4 times per day)  OTC Ibuprofen (Motrin) 200 mg tablets - take max 4 tablets (800 mg) every 6 hours*   Sinus Congestion Prescription Atrovent as directed OTC Nasal Saline if desired to rinse OTC Oxymetolazone (Afrin, others) sparing use due to rebound congestion, NEVER use in kids OTC Phenylephrine (Sudafed) 10 mg tablets every 4 hours (or the 12-hour  formulation)* OTC Diphenhydramine (Benadryl) 25 mg tablets - take max 2 tablets every 4 hours   Cough & Sore Throat Prescription cough pills or syrups as directed OTC Dextromethorphan (Robitussin, others) - cough suppressant OTC Guaifenesin (Robitussin, Mucinex, others) - expectorant (helps cough up mucus) (Dextromethorphan and Guaifenesin also come in a combination tablet/syrup) OTC Lozenges w/ Benzocaine + Menthol (Cepacol) Honey - as much as you want! Teas which "coat the throat" - look for ingredients Elm Bark, Licorice Root, Marshmallow Root   Other Prescription Oral Steroids to decrease inflammation and improve energy Prescription Antibiotics if these are necessary for bacterial infection - take ALL, even if you're feeling better  OTC Zinc Lozenges within 24 hours of symptoms onset - mixed evidence this shortens the duration of the common cold Don't waste your money on Vitamin C or Echinacea in acute illness - it's already too late!    *Caution in patients with high blood pressure       If you have lab work done today you will be contacted with your lab results within the next 2 weeks.  If you have not heard from us then please contact us. The fastest way to get your results is to register for My Chart.   IF you received an x-ray today, you will receive an invoice from [facility]. Please contact [facility] at [phone] with questions or concerns regarding your invoice.   IF you received labwork today, you will receive an invoice from [facility]. Please contact [facility] at [phone] with questions or concerns regarding your invoice.   Our billing staff will not be able to assist you with questions regarding bills from these companies.  You will be contacted with the lab results as soon as they are available. The fastest way to get your results is to activate your My Chart account. Instructions are located on the last page of this paperwork. If you have  not heard from us regarding the results in 2 weeks, please contact this office.         Visit summary with medication list and pertinent instructions was printed for patient to review. All questions at time of visit were answered - patient instructed to contact office with any additional concerns. ER/RTC precautions were reviewed with the patient.   Follow-up plan: Return if symptoms worsen or fail to improve.    Please note: voice recognition software was used to produce this document, and typos may escape review. Please contact Dr. Lyn HollingsheadAlexander for any needed clarifications.

## 2018-04-14 NOTE — Patient Instructions (Addendum)
Medications & Home Remedies for Upper Respiratory Illness   Note: the following list assumes no pregnancy, normal liver & kidney function and no other drug interactions. Dr. Alexander has highlighted medications which are safe for you to use, but these may not be appropriate for everyone. Always ask a pharmacist or qualified medical provider if you have any questions!    Aches/Pains, Fever, Headache OTC Acetaminophen (Tylenol) 500 mg tablets - take max 2 tablets (1000 mg) every 6 hours (4 times per day)  OTC Ibuprofen (Motrin) 200 mg tablets - take max 4 tablets (800 mg) every 6 hours*   Sinus Congestion Prescription Atrovent as directed OTC Nasal Saline if desired to rinse OTC Oxymetolazone (Afrin, others) sparing use due to rebound congestion, NEVER use in kids OTC Phenylephrine (Sudafed) 10 mg tablets every 4 hours (or the 12-hour formulation)* OTC Diphenhydramine (Benadryl) 25 mg tablets - take max 2 tablets every 4 hours   Cough & Sore Throat Prescription cough pills or syrups as directed OTC Dextromethorphan (Robitussin, others) - cough suppressant OTC Guaifenesin (Robitussin, Mucinex, others) - expectorant (helps cough up mucus) (Dextromethorphan and Guaifenesin also come in a combination tablet/syrup) OTC Lozenges w/ Benzocaine + Menthol (Cepacol) Honey - as much as you want! Teas which "coat the throat" - look for ingredients Elm Bark, Licorice Root, Marshmallow Root   Other Prescription Oral Steroids to decrease inflammation and improve energy Prescription Antibiotics if these are necessary for bacterial infection - take ALL, even if you're feeling better  OTC Zinc Lozenges within 24 hours of symptoms onset - mixed evidence this shortens the duration of the common cold Don't waste your money on Vitamin C or Echinacea in acute illness - it's already too late!    *Caution in patients with high blood pressure       If you have lab work done today you will be contacted with  your lab results within the next 2 weeks.  If you have not heard from us then please contact us. The fastest way to get your results is to register for My Chart.   IF you received an x-ray today, you will receive an invoice from [facility]. Please contact [facility] at [phone] with questions or concerns regarding your invoice.   IF you received labwork today, you will receive an invoice from [facility]. Please contact [facility] at [phone] with questions or concerns regarding your invoice.   Our billing staff will not be able to assist you with questions regarding bills from these companies.  You will be contacted with the lab results as soon as they are available. The fastest way to get your results is to activate your My Chart account. Instructions are located on the last page of this paperwork. If you have not heard from us regarding the results in 2 weeks, please contact this office.      

## 2019-01-11 ENCOUNTER — Other Ambulatory Visit: Payer: Self-pay

## 2019-01-11 ENCOUNTER — Emergency Department (HOSPITAL_BASED_OUTPATIENT_CLINIC_OR_DEPARTMENT_OTHER)
Admission: EM | Admit: 2019-01-11 | Discharge: 2019-01-11 | Disposition: A | Discharge status: Home / Self Care | Payer: BLUE CROSS/BLUE SHIELD | Attending: Emergency Medicine | Admitting: Emergency Medicine

## 2019-01-11 ENCOUNTER — Encounter (HOSPITAL_BASED_OUTPATIENT_CLINIC_OR_DEPARTMENT_OTHER): Payer: Self-pay | Admitting: *Deleted

## 2019-01-11 DIAGNOSIS — Y990 Civilian activity done for income or pay: Secondary | ICD-10-CM | POA: Insufficient documentation

## 2019-01-11 DIAGNOSIS — Y939 Activity, unspecified: Secondary | ICD-10-CM | POA: Insufficient documentation

## 2019-01-11 DIAGNOSIS — S161XXA Strain of muscle, fascia and tendon at neck level, initial encounter: Secondary | ICD-10-CM | POA: Insufficient documentation

## 2019-01-11 DIAGNOSIS — Y9259 Other trade areas as the place of occurrence of the external cause: Secondary | ICD-10-CM | POA: Insufficient documentation

## 2019-01-11 DIAGNOSIS — M5412 Radiculopathy, cervical region: Secondary | ICD-10-CM

## 2019-01-11 DIAGNOSIS — X500XXA Overexertion from strenuous movement or load, initial encounter: Secondary | ICD-10-CM | POA: Insufficient documentation

## 2019-01-11 MED ORDER — KETOROLAC TROMETHAMINE 60 MG/2ML IM SOLN
60.0000 mg | Freq: Once | INTRAMUSCULAR | Status: AC
  Administered 2019-01-11: 60 mg via INTRAMUSCULAR
  Filled 2019-01-11: qty 2

## 2019-01-11 MED ORDER — CYCLOBENZAPRINE HCL 10 MG PO TABS: 10.0000 mg | ORAL_TABLET | Freq: Every day | ORAL | 0 refills | Status: DC

## 2019-01-11 MED ORDER — PREDNISONE 50 MG PO TABS: 50.0000 mg | ORAL_TABLET | Freq: Every day | ORAL | 0 refills | Status: DC

## 2019-01-11 MED ORDER — TRAMADOL HCL 50 MG PO TABS: 50.0000 mg | ORAL_TABLET | Freq: Four times a day (QID) | ORAL | 0 refills | Status: DC | PRN

## 2019-01-11 NOTE — ED Triage Notes (Signed)
Pt reports 3 days of bilateral arm and shoulder pain, right is more painful than left. Pt states she has limited ability to raise her arms without pain. Pt reports her employment involves a lot of reaching and lifting with her arms.

## 2019-01-11 NOTE — Discharge Instructions (Addendum)
Return here as needed.  Follow-up with a primary doctor.  Ice and heat on your neck and shoulder areas.

## 2019-01-11 NOTE — ED Notes (Signed)
C/o bilateral arm and shoulder pain x 3-4 days  States lifting at work  Same movement over and over

## 2019-01-15 NOTE — ED Provider Notes (Signed)
[place] EMERGENCY DEPARTMENT Provider Note   CSN: [id number] Arrival date & time: 01/11/19  1761     History   Chief Complaint Chief Complaint  Patient presents with  . Arm Pain    HPI Cassandra Yates is a 43 y.o. female.     HPI Patient presents to the emergency department with pain to the shoulders bilaterally and neck region.  The patient states he been doing a lot of heavy lifting above her head at work.  She states she started this job 3 weeks ago.  Patient states that movements palpation make the pain worse.  Patient states that she did not take any medications prior to arrival for symptoms.  Patient denies any chest pain, shortness breath, nausea, vomiting, weakness, dizziness, numbness or syncope. History reviewed. No pertinent past medical history.  There are no active problems to display for this patient.   Past Surgical History:  Procedure Laterality Date  . CESAREAN SECTION    . TUBAL LIGATION       OB History   No obstetric history on file.      Home Medications    Prior to Admission medications   Medication Sig Start Date End Date Taking? Authorizing Provider  cyclobenzaprine (FLEXERIL) 10 MG tablet Take 1 tablet (10 mg total) by mouth at bedtime. 01/11/19   Nathan Stallworth, Harrell Gave, PA-C  predniSONE (DELTASONE) 50 MG tablet Take 1 tablet (50 mg total) by mouth daily with breakfast. 01/11/19   Rianna Lukes, Harrell Gave, PA-C  traMADol (ULTRAM) 50 MG tablet Take 1 tablet (50 mg total) by mouth every 6 (six) hours as needed for severe pain. 01/11/19   Dalia Heading, PA-C    Family History History reviewed. No pertinent family history.  Social History Social History   Tobacco Use  . Smoking status: Never Smoker  . Smokeless tobacco: Never Used  Substance Use Topics  . Alcohol use: No  . Drug use: No     Allergies   Patient has no known allergies.   Review of Systems Review of Systems All other systems negative except as documented  in the HPI. All pertinent positives and negatives as reviewed in the HPI.  Physical Exam Updated Vital Signs Pulse 85   Temp 98.7 F (37.1 C) (Oral)   Resp 14   Ht 5\' 6"  (1.676 m)   Wt 89.4 kg   LMP 01/03/2019   SpO2 100%   BMI 31.80 kg/m   Physical Exam Vitals signs and nursing note reviewed.  Constitutional:      General: She is not in acute distress.    Appearance: She is well-developed.  HENT:     Head: Normocephalic and atraumatic.  Eyes:     Pupils: Pupils are equal, round, and reactive to light.  Pulmonary:     Effort: Pulmonary effort is normal.  Musculoskeletal:     Cervical back: She exhibits tenderness and pain. She exhibits normal range of motion, no bony tenderness and no swelling.       Back:  Skin:    General: Skin is warm and dry.  Neurological:     Mental Status: She is alert and oriented to person, place, and time.      ED Treatments / Results  Labs (all labs ordered are listed, but only abnormal results are displayed) Labs Reviewed - No data to display  EKG None  Radiology No results found.  Procedures Procedures (including critical care time)  Medications Ordered in ED Medications  ketorolac (TORADOL) injection  60 mg (60 mg Intramuscular Given 01/11/19 1118)     Initial Impression / Assessment and Plan / ED Course  I have reviewed the triage vital signs and the nursing notes.  Pertinent labs & imaging results that were available during my care of the patient were reviewed by me and considered in my medical decision making (see chart for details).      Patient be treated for cervical strain based on her HPI and physical exam findings.  Patient has no neurological deficits noted on exam.  She has good strength in her upper extremities and good sensation.  Patient is advised to follow-up with her primary care doctor and told to return here as needed.    Final Clinical Impressions(s) / ED Diagnoses   Final diagnoses:  Acute strain  of neck muscle, initial encounter  Cervical radiculopathy    ED Discharge Orders         Ordered    predniSONE (DELTASONE) 50 MG tablet  Daily with breakfast     01/11/19 1121    traMADol (ULTRAM) 50 MG tablet  Every 6 hours PRN     01/11/19 1121    cyclobenzaprine (FLEXERIL) 10 MG tablet  Daily at bedtime     01/11/19 [address], PA-C 01/15/19 1619    Terald Sleeper, MD 01/15/19 2041

## 2019-09-10 ENCOUNTER — Other Ambulatory Visit: Payer: Self-pay

## 2019-09-10 ENCOUNTER — Emergency Department (HOSPITAL_BASED_OUTPATIENT_CLINIC_OR_DEPARTMENT_OTHER): Payer: 59

## 2019-09-10 ENCOUNTER — Encounter (HOSPITAL_BASED_OUTPATIENT_CLINIC_OR_DEPARTMENT_OTHER): Payer: Self-pay

## 2019-09-10 ENCOUNTER — Emergency Department (HOSPITAL_BASED_OUTPATIENT_CLINIC_OR_DEPARTMENT_OTHER)
Admission: EM | Admit: 2019-09-10 | Discharge: 2019-09-10 | Disposition: A | Discharge status: Home / Self Care | Payer: 59 | Attending: Emergency Medicine | Admitting: Emergency Medicine

## 2019-09-10 DIAGNOSIS — M25541 Pain in joints of right hand: Secondary | ICD-10-CM

## 2019-09-10 DIAGNOSIS — M25561 Pain in right knee: Secondary | ICD-10-CM | POA: Diagnosis not present

## 2019-09-10 DIAGNOSIS — M25562 Pain in left knee: Secondary | ICD-10-CM | POA: Insufficient documentation

## 2019-09-10 DIAGNOSIS — M2559 Pain in other specified joint: Secondary | ICD-10-CM | POA: Insufficient documentation

## 2019-09-10 LAB — CBC WITH DIFFERENTIAL/PLATELET
Abs Immature Granulocytes: 0.01 10*3/uL (ref 0.00–0.07)
Basophils Absolute: 0 10*3/uL (ref 0.0–0.1)
Basophils Relative: 0 %
Eosinophils Absolute: 0 10*3/uL (ref 0.0–0.5)
Eosinophils Relative: 1 %
HCT: 30.4 % — ABNORMAL LOW (ref 36.0–46.0)
Hemoglobin: 9.7 g/dL — ABNORMAL LOW (ref 12.0–15.0)
Immature Granulocytes: 0 %
Lymphocytes Relative: 24 %
Lymphs Abs: 1.1 10*3/uL (ref 0.7–4.0)
MCH: 25.8 pg — ABNORMAL LOW (ref 26.0–34.0)
MCHC: 31.9 g/dL (ref 30.0–36.0)
MCV: 80.9 fL (ref 80.0–100.0)
Monocytes Absolute: 0.8 10*3/uL (ref 0.1–1.0)
Monocytes Relative: 16 %
Neutro Abs: 2.9 10*3/uL (ref 1.7–7.7)
Neutrophils Relative %: 59 %
Platelets: 448 10*3/uL — ABNORMAL HIGH (ref 150–400)
RBC: 3.76 MIL/uL — ABNORMAL LOW (ref 3.87–5.11)
RDW: 14.7 % (ref 11.5–15.5)
WBC: 4.6 10*3/uL (ref 4.0–10.5)
nRBC: 0 % (ref 0.0–0.2)

## 2019-09-10 LAB — BASIC METABOLIC PANEL
Anion gap: 9 (ref 5–15)
BUN: 9 mg/dL (ref 6–20)
CO2: 25 mmol/L (ref 22–32)
Calcium: 9.1 mg/dL (ref 8.9–10.3)
Chloride: 102 mmol/L (ref 98–111)
Creatinine, Ser: 0.66 mg/dL (ref 0.44–1.00)
GFR calc Af Amer: >60 mL/min (ref >60)
GFR calc non Af Amer: >60 mL/min (ref >60)
Glucose, Bld: 97 mg/dL (ref 70–99)
Potassium: 4.1 mmol/L (ref 3.5–5.1)
Sodium: 136 mmol/L (ref 135–145)

## 2019-09-10 LAB — SEDIMENTATION RATE: Sed Rate: 129 mm/hr — ABNORMAL HIGH (ref 0–22)

## 2019-09-10 MED ORDER — TRAMADOL HCL 50 MG PO TABS
50.0000 mg | ORAL_TABLET | Freq: Once | ORAL | Status: AC
  Administered 2019-09-10: 50 mg via ORAL
  Filled 2019-09-10: qty 1

## 2019-09-10 MED ORDER — KETOROLAC TROMETHAMINE 30 MG/ML IJ SOLN
30.0000 mg | Freq: Once | INTRAMUSCULAR | Status: AC
  Administered 2019-09-10: 30 mg via INTRAVENOUS
  Filled 2019-09-10: qty 1

## 2019-09-10 MED ORDER — KETOROLAC TROMETHAMINE 60 MG/2ML IM SOLN
60.0000 mg | Freq: Once | INTRAMUSCULAR | Status: DC
  Filled 2019-09-10: qty 2

## 2019-09-10 MED ORDER — TRAMADOL HCL 50 MG PO TABS
ORAL_TABLET | ORAL | Status: AC
  Filled 2019-09-10: qty 1

## 2019-09-10 MED ORDER — PREDNISONE 10 MG PO TABS: 20.0000 mg | ORAL_TABLET | Freq: Two times a day (BID) | ORAL | 0 refills | Status: DC

## 2019-09-10 NOTE — ED Provider Notes (Signed)
[facility] EMERGENCY DEPARTMENT Provider Note   CSN: [id number] Arrival date & time: 09/10/19  1141     History Chief Complaint  Patient presents with  . Pain    Cassandra Yates is a 44 y.o. female.  Patient is a 44 year old female presenting with complaints of pain in her knees, hands, and shoulders.  She states she is having discomfort with ambulation and is unable to completely flex her fingers without discomfort.  This has been worsening over the past several months, but has been more severe over the past month.  She denies any fevers or chills.  She denies any specific injury or trauma, but does perform repetitive movements at work throughout the day.  Patient denies any history of arthritis either personally or in her family.  The history is provided by the patient.       History reviewed. No pertinent past medical history.  There are no problems to display for this patient.   Past Surgical History:  Procedure Laterality Date  . CESAREAN SECTION    . TUBAL LIGATION       OB History   No obstetric history on file.     No family history on file.  Social History   Tobacco Use  . Smoking status: Never Smoker  . Smokeless tobacco: Never Used  Substance Use Topics  . Alcohol use: No  . Drug use: No    Home Medications Prior to Admission medications   Medication Sig Start Date End Date Taking? Authorizing Provider  cyclobenzaprine (FLEXERIL) 10 MG tablet Take 1 tablet (10 mg total) by mouth at bedtime. 01/11/19   Lawyer, Cristal Deer, PA-C  predniSONE (DELTASONE) 50 MG tablet Take 1 tablet (50 mg total) by mouth daily with breakfast. 01/11/19   Lawyer, Cristal Deer, PA-C  traMADol (ULTRAM) 50 MG tablet Take 1 tablet (50 mg total) by mouth every 6 (six) hours as needed for severe pain. 01/11/19   Lawyer, Cristal Deer, PA-C    Allergies    Patient has no known allergies.  Review of Systems   Review of Systems  All other systems reviewed and are  negative.   Physical Exam Updated Vital Signs BP (!) 149/109 (BP Location: Left Arm)   Pulse (!) 104   Temp 98.1 F (36.7 C) (Oral)   Resp 16   Ht 5\' 6"  (1.676 m)   Wt 71.7 kg   LMP 09/05/2019 (Approximate)   SpO2 100%   BMI 25.50 kg/m   Physical Exam Vitals and nursing note reviewed.  Constitutional:      General: She is not in acute distress.    Appearance: She is well-developed. She is not diaphoretic.  HENT:     Head: Normocephalic and atraumatic.  Pulmonary:     Effort: Pulmonary effort is normal.  Musculoskeletal:        General: Normal range of motion.     Cervical back: Normal range of motion and neck supple.     Comments: Bilateral knees appear grossly normal.  There is no significant effusion.  She does have slight crepitus with flexion of the knee.  They are stable with anterior/posterior drawer test and there is no instability with varus or valgus stress.  Bilateral hands appear grossly normal.  She does have increasing pain with full flexion of the fingers.  Skin:    General: Skin is warm and dry.  Neurological:     Mental Status: She is alert and oriented to person, place, and time.  ED Results / Procedures / Treatments   Labs (all labs ordered are listed, but only abnormal results are displayed) Labs Reviewed  BASIC METABOLIC PANEL  CBC WITH DIFFERENTIAL/PLATELET  SEDIMENTATION RATE    EKG None  Radiology No results found.  Procedures Procedures (including critical care time)  Medications Ordered in ED Medications  ketorolac (TORADOL) injection 60 mg (has no administration in time range)  traMADol (ULTRAM) tablet 50 mg (has no administration in time range)    ED Course  I have reviewed the triage vital signs and the nursing notes.  Pertinent labs & imaging results that were available during my care of the patient were reviewed by me and considered in my medical decision making (see chart for details).    MDM  Rules/Calculators/A&P  Patient presenting here with complaints of pain in her knees, shoulders, and hands that has been ongoing for several months.  Patient denies any specific injury or trauma.  She is concerned she may have some sort of arthritic condition.  Her physical examination is unremarkable.  I did perform x-rays of her knees which were negative.  She does have some crepitus with range of motion of the knees, but otherwise appears stable.  I was certain as to why the patient is having discomfort in her joints, however nothing today appears emergent or acute.  I did obtain a sed rate, however this test is pending and likely will not result until tomorrow.  At this point, I see no indication for further work-up.  Patient is feeling better with Toradol.  She will be discharged with prednisone and as needed return.  She is to follow-up with her primary doctor if not improving.  Final Clinical Impression(s) / ED Diagnoses Final diagnoses:  None    Rx / DC Orders ED Discharge Orders    None       Veryl Speak, MD 09/10/19 1410

## 2019-09-10 NOTE — Discharge Instructions (Addendum)
Begin taking prednisone as prescribed. ? ?Follow-up with your primary doctor if symptoms are not improving in the next week. ?

## 2019-09-10 NOTE — ED Triage Notes (Addendum)
Pt c/o pain to both hands, knees and shoulders x 3 weeks-denies fever/flu sx-states she has lost 20lbs in the last 3 mos-states she had loss of taste-tested neg for covid x 2 last month-NAD-steady slow gait

## 2020-09-11 ENCOUNTER — Ambulatory Visit: Payer: Self-pay | Admitting: *Deleted

## 2020-09-11 ENCOUNTER — Other Ambulatory Visit: Payer: Self-pay

## 2020-09-11 VITALS — BP 160/106 | Ht 64.0 in | Wt 174.0 lb

## 2020-09-11 DIAGNOSIS — Z0000 Encounter for general adult medical examination without abnormal findings: Secondary | ICD-10-CM

## 2020-09-11 NOTE — Progress Notes (Signed)
[facility] insurance premium discount evaluation: Labs Drawn. Replacements ROI form signed. Tobacco Free Attestation form signed.  Forms placed in paper chart.   BP med ordered by pcp 6/6 but pt has not started yet. Discussion had regarding HTN and adverse effects. Pt willing to start med and likely will this weekend.

## 2020-09-12 ENCOUNTER — Encounter: Payer: Self-pay | Admitting: Registered Nurse

## 2020-09-12 LAB — CMP12+LP+TP+TSH+6AC+CBC/D/PLT
ALT: 12 IU/L (ref 0–32)
AST: 18 IU/L (ref 0–40)
Albumin/Globulin Ratio: 1 — ABNORMAL LOW (ref 1.2–2.2)
Albumin: 3.8 g/dL (ref 3.8–4.8)
Alkaline Phosphatase: 80 IU/L (ref 44–121)
BUN/Creatinine Ratio: 8 — ABNORMAL LOW (ref 9–23)
BUN: 6 mg/dL (ref 6–24)
Basophils Absolute: 0 10*3/uL (ref 0.0–0.2)
Basos: 0 %
Bilirubin Total: 0.6 mg/dL (ref 0.0–1.2)
Calcium: 9 mg/dL (ref 8.7–10.2)
Chloride: 102 mmol/L (ref 96–106)
Chol/HDL Ratio: 2.6 ratio (ref 0.0–4.4)
Cholesterol, Total: 139 mg/dL (ref 100–199)
Creatinine, Ser: 0.75 mg/dL (ref 0.57–1.00)
EOS (ABSOLUTE): 0 10*3/uL (ref 0.0–0.4)
Eos: 1 %
Estimated CHD Risk: <0.5 times avg. (ref 0.0–1.0)
Free Thyroxine Index: 1.9 (ref 1.2–4.9)
GGT: 13 IU/L (ref 0–60)
Globulin, Total: 3.8 g/dL (ref 1.5–4.5)
Glucose: 86 mg/dL (ref 65–99)
HDL: 54 mg/dL (ref >39)
Hematocrit: 32.3 % — ABNORMAL LOW (ref 34.0–46.6)
Hemoglobin: 10.2 g/dL — ABNORMAL LOW (ref 11.1–15.9)
Immature Grans (Abs): 0 10*3/uL (ref 0.0–0.1)
Immature Granulocytes: 0 %
Iron: 45 ug/dL (ref 27–159)
LDH: 188 IU/L (ref 119–226)
LDL Chol Calc (NIH): 71 mg/dL (ref 0–99)
Lymphocytes Absolute: 1.1 10*3/uL (ref 0.7–3.1)
Lymphs: 24 %
MCH: 25.6 pg — ABNORMAL LOW (ref 26.6–33.0)
MCHC: 31.6 g/dL (ref 31.5–35.7)
MCV: 81 fL (ref 79–97)
Monocytes Absolute: 0.6 10*3/uL (ref 0.1–0.9)
Monocytes: 13 %
Neutrophils Absolute: 2.9 10*3/uL (ref 1.4–7.0)
Neutrophils: 62 %
Phosphorus: 4.1 mg/dL (ref 3.0–4.3)
Platelets: 364 10*3/uL (ref 150–450)
Potassium: 3.9 mmol/L (ref 3.5–5.2)
RBC: 3.98 x10E6/uL (ref 3.77–5.28)
RDW: 14.9 % (ref 11.7–15.4)
Sodium: 135 mmol/L (ref 134–144)
T3 Uptake Ratio: 26 % (ref 24–39)
T4, Total: 7.2 ug/dL (ref 4.5–12.0)
TSH: 1.45 u[IU]/mL (ref 0.450–4.500)
Total Protein: 7.6 g/dL (ref 6.0–8.5)
Triglycerides: 70 mg/dL (ref 0–149)
Uric Acid: 4.5 mg/dL (ref 2.6–6.2)
VLDL Cholesterol Cal: 14 mg/dL (ref 5–40)
WBC: 4.6 10*3/uL (ref 3.4–10.8)
eGFR: 99 mL/min/{1.73_m2} (ref >59)

## 2020-09-12 LAB — HGB A1C W/O EAG: Hgb A1c MFr Bld: 5.2 % (ref 4.8–5.6)

## 2020-09-17 NOTE — Progress Notes (Signed)
Reviewed RN Rolly Salter  note BP still elevated and patient taking her Hyzaar recommend follow up with PCM.  RN Rolly Salter notified.  Perimenopausal with heavy menses.

## 2020-09-17 NOTE — Progress Notes (Signed)
BP 146/102 today in clinic. Pt had not started HYzaar at time of lab draw. Did start it the next day and has been taking in the morning, same time daily. Readings at home with wrist cuff around 140's/90's. Reviewed notes and handouts on MyChart. Does endorse heavy periods at times but not consistently as she is peri-menopausal.

## 2021-05-21 ENCOUNTER — Ambulatory Visit: Payer: Self-pay | Admitting: *Deleted

## 2021-05-21 ENCOUNTER — Other Ambulatory Visit: Payer: Self-pay

## 2021-05-21 VITALS — BP 135/83 | HR 83

## 2021-05-21 DIAGNOSIS — M25512 Pain in left shoulder: Secondary | ICD-10-CM

## 2021-05-21 NOTE — Progress Notes (Signed)
Pt in to clinic c/o L shoulder pain. 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 Reports pain only occurs when she is flexed out to unboxing and is lifting heavier items from lower positions, taking out of box, placing on a table, then moving product to another table. Heaver weights and different repetitive motions than she is used to. Sts she can typically tolerate 4 hours in that position but she has been having to do 8 hours and shoulder pain has been worsening over past week. Rates it 7/10 today. Pain over top of shoulder, not in joint/cuff. Able to abduct L leg to approx 75 degrees but higher than that causes pain. Lifting heavier than about 10lb causes pain.  Discussed with pt that clinic cannot provide work restrictions. Will treat acutely in clinic and then pt to f/u if continuing to occur or fails to improve, and can be seen by NP in clinic, and can refer to [facility] if restrictions are indicated. Pt scheduled to work tomorrow (Saturday) then off Sun and Mon. Applied Thermacare joint wrap to shoulder. Provided a second patch for tomorrow. Provided ibuprofen, Tylenol and biofreeze from clinic stock with use instructions. Pt appreciative, denies any further questions or concerns.

## 2021-08-17 ENCOUNTER — Other Ambulatory Visit: Payer: Self-pay | Admitting: Registered Nurse

## 2021-08-18 ENCOUNTER — Telehealth: Payer: Self-pay | Admitting: Registered Nurse

## 2021-08-18 DIAGNOSIS — E559 Vitamin D deficiency, unspecified: Secondary | ICD-10-CM

## 2021-08-18 DIAGNOSIS — D500 Iron deficiency anemia secondary to blood loss (chronic): Secondary | ICD-10-CM

## 2021-08-18 DIAGNOSIS — R7303 Prediabetes: Secondary | ICD-10-CM

## 2021-08-18 LAB — CMP12+LP+TP+TSH+6AC+CBC/D/PLT
ALT: 13 IU/L (ref 0–32)
AST: 19 IU/L (ref 0–40)
Albumin/Globulin Ratio: 1 — ABNORMAL LOW (ref 1.2–2.2)
Albumin: 3.8 g/dL (ref 3.8–4.8)
Alkaline Phosphatase: 89 IU/L (ref 44–121)
BUN/Creatinine Ratio: 13 (ref 9–23)
BUN: 11 mg/dL (ref 6–24)
Basophils Absolute: 0 10*3/uL (ref 0.0–0.2)
Basos: 0 %
Bilirubin Total: 0.3 mg/dL (ref 0.0–1.2)
Calcium: 9.5 mg/dL (ref 8.7–10.2)
Chloride: 100 mmol/L (ref 96–106)
Chol/HDL Ratio: 3.2 ratio (ref 0.0–4.4)
Cholesterol, Total: 139 mg/dL (ref 100–199)
Creatinine, Ser: 0.83 mg/dL (ref 0.57–1.00)
EOS (ABSOLUTE): 0.1 10*3/uL (ref 0.0–0.4)
Eos: 2 %
Estimated CHD Risk: <0.5 times avg. (ref 0.0–1.0)
Free Thyroxine Index: 1.4 (ref 1.2–4.9)
GGT: 14 IU/L (ref 0–60)
Globulin, Total: 3.7 g/dL (ref 1.5–4.5)
Glucose: 101 mg/dL — ABNORMAL HIGH (ref 70–99)
HDL: 44 mg/dL (ref >39)
Hematocrit: 33.3 % — ABNORMAL LOW (ref 34.0–46.6)
Hemoglobin: 10.8 g/dL — ABNORMAL LOW (ref 11.1–15.9)
Immature Grans (Abs): 0 10*3/uL (ref 0.0–0.1)
Immature Granulocytes: 0 %
Iron: 28 ug/dL (ref 27–159)
LDH: 154 IU/L (ref 119–226)
LDL Chol Calc (NIH): 70 mg/dL (ref 0–99)
Lymphocytes Absolute: 1.8 10*3/uL (ref 0.7–3.1)
Lymphs: 35 %
MCH: 26.7 pg (ref 26.6–33.0)
MCHC: 32.4 g/dL (ref 31.5–35.7)
MCV: 82 fL (ref 79–97)
Monocytes Absolute: 1.1 10*3/uL — ABNORMAL HIGH (ref 0.1–0.9)
Monocytes: 21 %
Neutrophils Absolute: 2.2 10*3/uL (ref 1.4–7.0)
Neutrophils: 42 %
Phosphorus: 4.4 mg/dL — ABNORMAL HIGH (ref 3.0–4.3)
Platelets: 339 10*3/uL (ref 150–450)
Potassium: 3.9 mmol/L (ref 3.5–5.2)
RBC: 4.05 x10E6/uL (ref 3.77–5.28)
RDW: 15.5 % — ABNORMAL HIGH (ref 11.7–15.4)
Sodium: 138 mmol/L (ref 134–144)
T3 Uptake Ratio: 25 % (ref 24–39)
T4, Total: 5.5 ug/dL (ref 4.5–12.0)
TSH: 3.17 u[IU]/mL (ref 0.450–4.500)
Total Protein: 7.5 g/dL (ref 6.0–8.5)
Triglycerides: 142 mg/dL (ref 0–149)
Uric Acid: 5.5 mg/dL (ref 2.6–6.2)
VLDL Cholesterol Cal: 25 mg/dL (ref 5–40)
WBC: 5.3 10*3/uL (ref 3.4–10.8)
eGFR: 87 mL/min/{1.73_m2} (ref >59)

## 2021-08-18 LAB — HGB A1C W/O EAG: Hgb A1c MFr Bld: 5.7 % — ABNORMAL HIGH (ref 4.8–5.6)

## 2021-08-18 LAB — VITAMIN D 25 HYDROXY (VIT D DEFICIENCY, FRACTURES): Vit D, 25-Hydroxy: 6.8 ng/mL — ABNORMAL LOW (ref 30.0–100.0)

## 2021-08-18 MED ORDER — CHOLECALCIFEROL 1.25 MG (50000 UT) PO TABS: 1.0000 | ORAL_TABLET | ORAL | 0 refills | Status: AC

## 2021-08-18 NOTE — Progress Notes (Signed)
[facility] 2023 labs drawn 08/17/21 see results note

## 2021-08-18 NOTE — Telephone Encounter (Signed)
From [facility] portal drawn 08/17/21 ? ?Test Results Flag Units Reference Interval ?Chemistries  ?Glucose  ?101 ?High mg/dL 70 - 99 ?Uric Acid  ?5.5 ?                          Therapeutic target for gout patients: <6.0 ?mg/dL 2.6 - 6.2 ?BUN  ?11 ?mg/dL 6 - 24 ?Creatinine  ?0.83 ?mg/dL 0.57 - 1.00 ?eGFR  ?87  mL/min/1.73  ?>59 ?BUN/Creatinine Ratio  ?13 ?9 - 23 ?Sodium  ?138 ?mmol/L 134 - 144 ?Potassium  ?3.9 ?mmol/L 3.5 - 5.2 ?Chloride  ?100 ?mmol/L 96 - 106 ?Calcium  ?9.5 ?mg/dL 8.7 - 10.2 ?Phosphorus  ?4.4 ?High mg/dL 3.0 - 4.3 ?Protein, Total  ?7.5 ?g/dL 6.0 - 8.5 ?Albumin  ?3.8 ?g/dL 3.8 - 4.8 ?Globulin, Total  ?3.7 ?g/dL 1.5 - 4.5 ?A/G Ratio  ?1.0 ?Low  1.2 - 2.2 ?Bilirubin, Total  ?0.3 ?mg/dL 0.0 - 1.2 ?Alkaline Phosphatase  ?89 ?IU/L 44 - 121 ?LDH  ?154 ?IU/L 119 - 226 ?AST (SGOT)  ?19 ?IU/L 0 - 40 ?ALT (SGPT)  ?13 ?IU/L 0 - 32 ?GGT  ?14 ?IU/L 0 - 60 ?Iron  ?28 ?ug/dL 27 - 159 ?Marland Kitchen  ?Lipids  ?Cholesterol, Total  ?139 ?mg/dL 100 - 199 ?Triglycerides  ?142 ?mg/dL 0 - 149 ?HDL Cholesterol  ?44  mg/dL  ?>39 ?VLDL Cholesterol Cal  ?25 ?mg/dL 5 - 40 ?LDL Chol Calc (NIH)  ?70 ?mg/dL 0 - 99 ?T. Chol/HDL Ratio  ?3.2 ?ratio 0.0 - 4.4 ?Please Note:  ?                                                 T. Chol/HDL Ratio ?                                                           Men  Women ?                                             1/2 Avg.Risk  3.4    3.3 ?                                                 Avg.Risk  5.0    4.4 ?                                              2X Avg.Risk  9.6    7.1 ?                                              3X Avg.Risk 23.4   11.0 ?Estimated CHD  Risk  ?< 0.5 ?               The CHD Risk is based on the T. Chol/HDL ratio. Other ?               factors affect CHD Risk such as hypertension, smoking, ?               diabetes, severe obesity, and family history of ?               premature CHD. ?times avg. 0.0 - 1.0 ?.  ?Thyroid  ?TSH  ?3.170 ?uIU/mL 0.450 - 4.500 ?Thyroxine (T4)  ?5.5 ?ug/dL 4.5  - 12.0 ?T3 Uptake  ?25 ?% 24 - 39 ?Free Thyroxine Index  ?1.4 ?1.2 - 4.9 ?.  ?CBC, Platelet Ct, and Diff  ?WBC Will Follow    ?RBC Will Follow    ?Hemoglobin Will Follow    ?Hematocrit Will Follow    ?MCV Will Follow    ?[facility] Will Follow    ?MCHC Will Follow    ?RDW Will Follow    ?Platelets Will Follow    ?Neutrophils Will Follow    ?Lymphs Will Follow    ?Monocytes Will Follow    ?Eos Will Follow    ?Basos Will Follow    ?Immature Cells Will Follow    ?Neutrophils (Absolute) Will Follow    ?Lymphs (Absolute) Will Follow    ?Monocytes(Absolute) Will Follow    ?Eos (Absolute) Will Follow    ?Baso (Absolute) Will Follow    ?Immature Granulocytes Will Follow    ?Immature Grans (Abs) Will Follow    ?NRBC Will Follow    ?Hematology Comments: Will Follow    ?Hemoglobin A1c (#599774) ?Test Results Flag Units Reference Interval ?Hemoglobin A1c  ?5.7 ?High % 4.8 - 5.6 ?Please Note:  ?                                                       . ?         Prediabetes: 5.7 - 6.4 ?         Diabetes: >6.4 ?         Glycemic control for adults with diabetes: <7.0 ?Vitamin D, 25-Hydroxy (#142395) ?Test Results Flag Units Reference Interval ?Vitamin D, 25-Hydroxy  ?6.8 ?Vitamin D deficiency has been defined by the Institute of ?Medicine and an Endocrine Society practice guideline as a ?level of serum 25-OH vitamin D less than 20 ng/mL (1,2). ?The Endocrine Society went on to further define vitamin D ?insufficiency as a level between 21 and 29 ng/mL (2). ?1. IOM (Institute of Medicine). 2010. Dietary reference ?   intakes for calcium and D. [place]: The ?   [employer]. ?2. Holick MF, Binkley [employer]eral, Bischoff-Ferrari HA, et al. ?   Evaluation, treatment, and prevention of vitamin D ?   deficiency: an Endocrine Society clinical practice ?   guideline. JCEM. 2011 Jul; 96(7):1911-30. ?

## 2021-08-31 ENCOUNTER — Ambulatory Visit: Payer: Self-pay | Admitting: *Deleted

## 2021-08-31 VITALS — BP 124/81 | HR 80 | Ht 65.0 in | Wt 188.0 lb

## 2021-08-31 DIAGNOSIS — Z0000 Encounter for general adult medical examination without abnormal findings: Secondary | ICD-10-CM

## 2021-08-31 NOTE — Progress Notes (Signed)
Results reviewed with pt in clinic per NP notes. BP/Ht/Wt obtained for finalizing Be Well.  Pt reports Hx of uterine fibroid so menstrual cycle is "crazy" but sts has been improving some. Usually heavy bleeding but improved compared to recent years. Uses 4-5 pads per day during cycle.  Reviewed Vit D result and weekly supplement dosing. Pt will pick this up and start taking this week. Will repeat Vit D and CBC in 3 months, 12/02/21 scheduled.  BP much improved today from previous. Taking medication regularly. Rare missed doses.  Denies further questions or concerns.

## 2021-08-31 NOTE — Progress Notes (Signed)
Be Well insurance premium discount evaluation: Labs Drawn by [facility] onsite previously 08/17/21. [facility] ROI form signed. Tobacco Free Attestation form signed.  Forms placed in paper chart.  

## 2021-09-06 NOTE — Progress Notes (Signed)
Noted reviewed RN Rolly Salter note.  Will repeat Vitamin D, CBC in 3 months.  Patient starting vitamin D supplement.  Follow up with gynecology if menses continues heavy and anemia.  BP much improved but noted 14lb weight gain in the past year recommend weight loss/follow up with dietitian [facility] has 6 free visits

## 2021-09-22 NOTE — Telephone Encounter (Signed)
Patient saw RN Rolly Salter on 08/31/21 and discussed lab results.  Noted per RN Rolly Salter note "Results reviewed with pt in clinic per NP notes. BP/Ht/Wt obtained for finalizing Be Well.  Pt reports Hx of uterine fibroid so menstrual cycle is "crazy" but sts has been improving some. Usually heavy bleeding but improved compared to recent years. Uses 4-5 pads per day during cycle.  Reviewed Vit D result and weekly supplement dosing. Pt will pick this up and start taking this week. Will repeat Vit D and CBC in 3 months, 12/02/21 scheduled.  BP much improved today from previous. Taking medication regularly. Rare missed doses.  Denies further questions or concerns."

## 2021-11-05 IMAGING — CR DG KNEE COMPLETE 4+V*R*
4 series · 4 of 4 positions shown · non-contrast
Comparison: None.

CLINICAL DATA: Bilateral knee pain, left greater than right

EXAM:
LEFT KNEE - COMPLETE 4+ VIEW; RIGHT KNEE - COMPLETE 4+ VIEW

[t knee ap right]
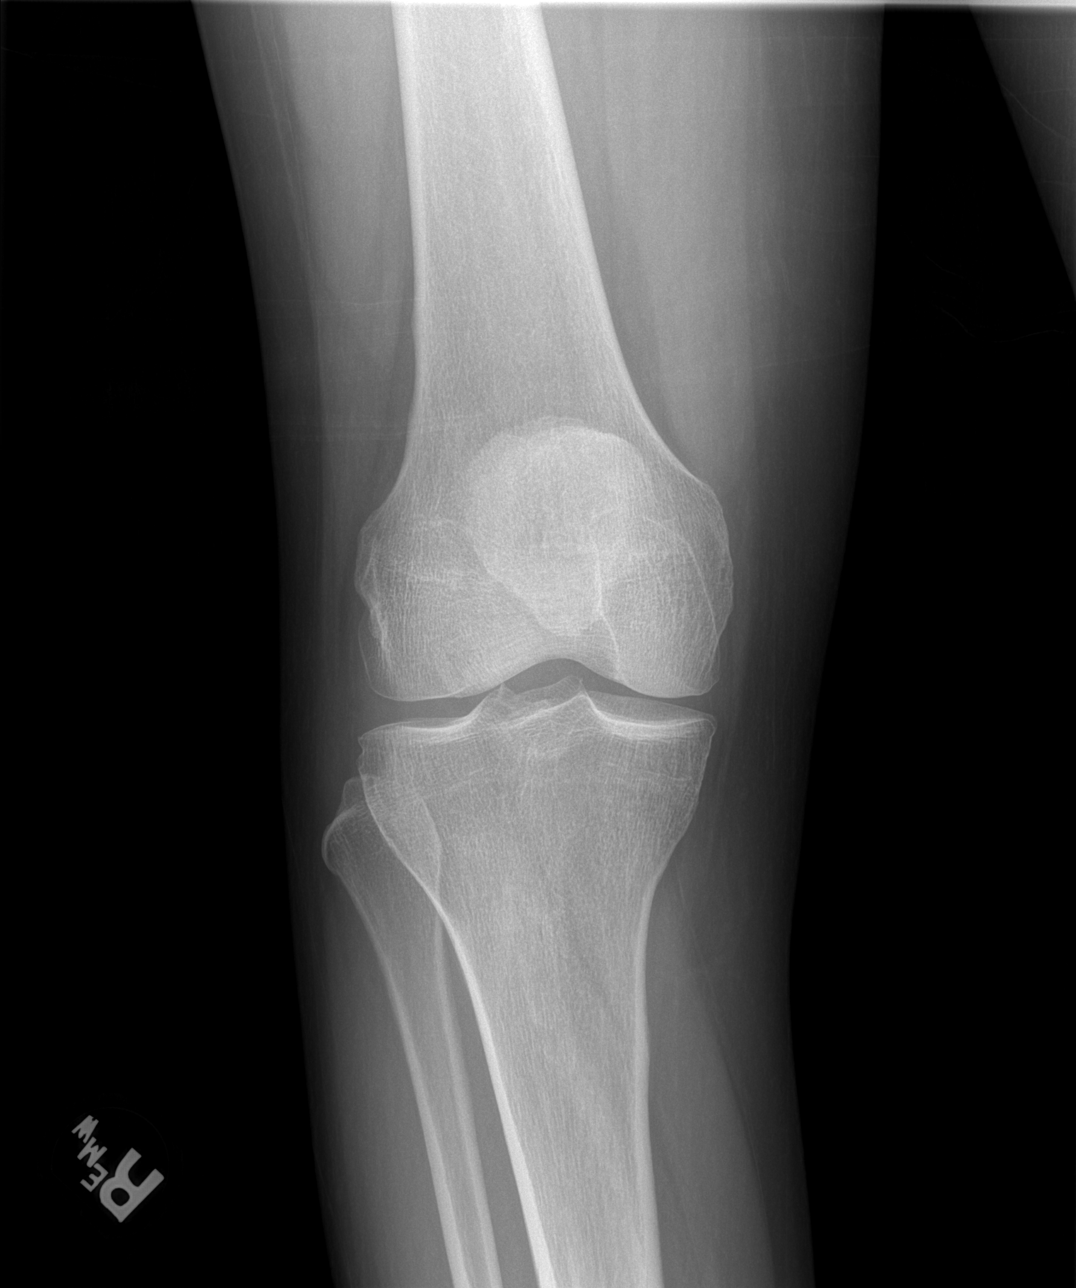

[t knee oblique right (1 of 2)]
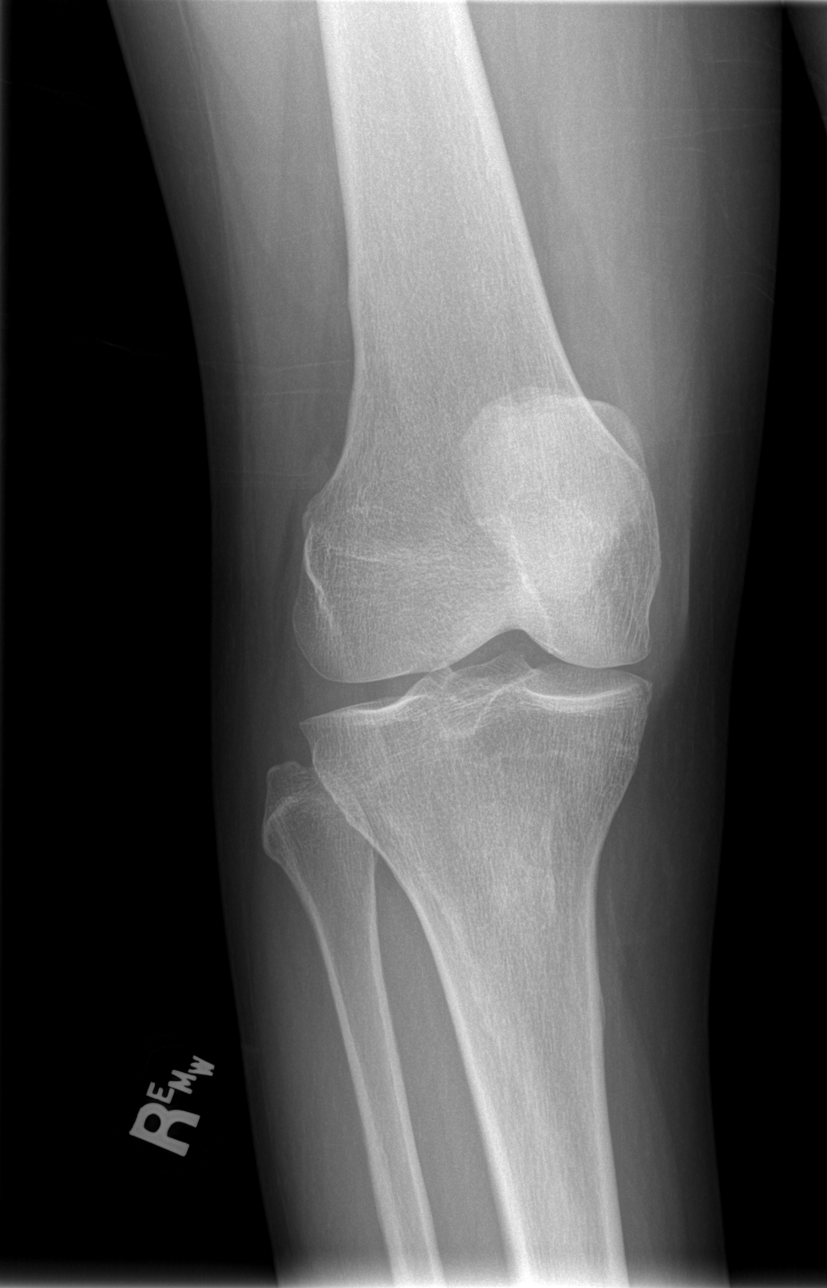

[t knee oblique right (2 of 2)]
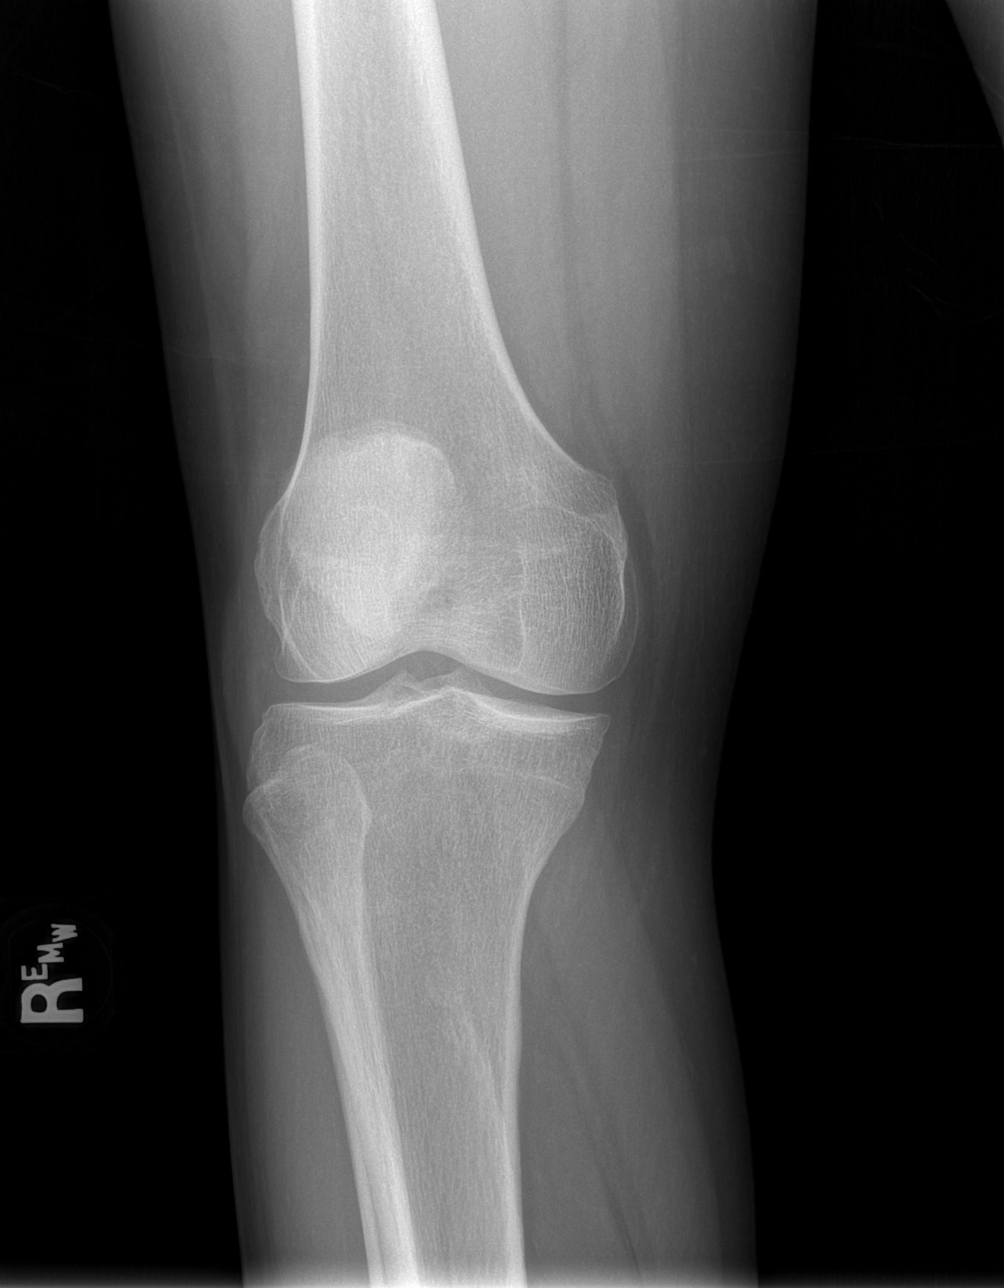

[t knee lat right]
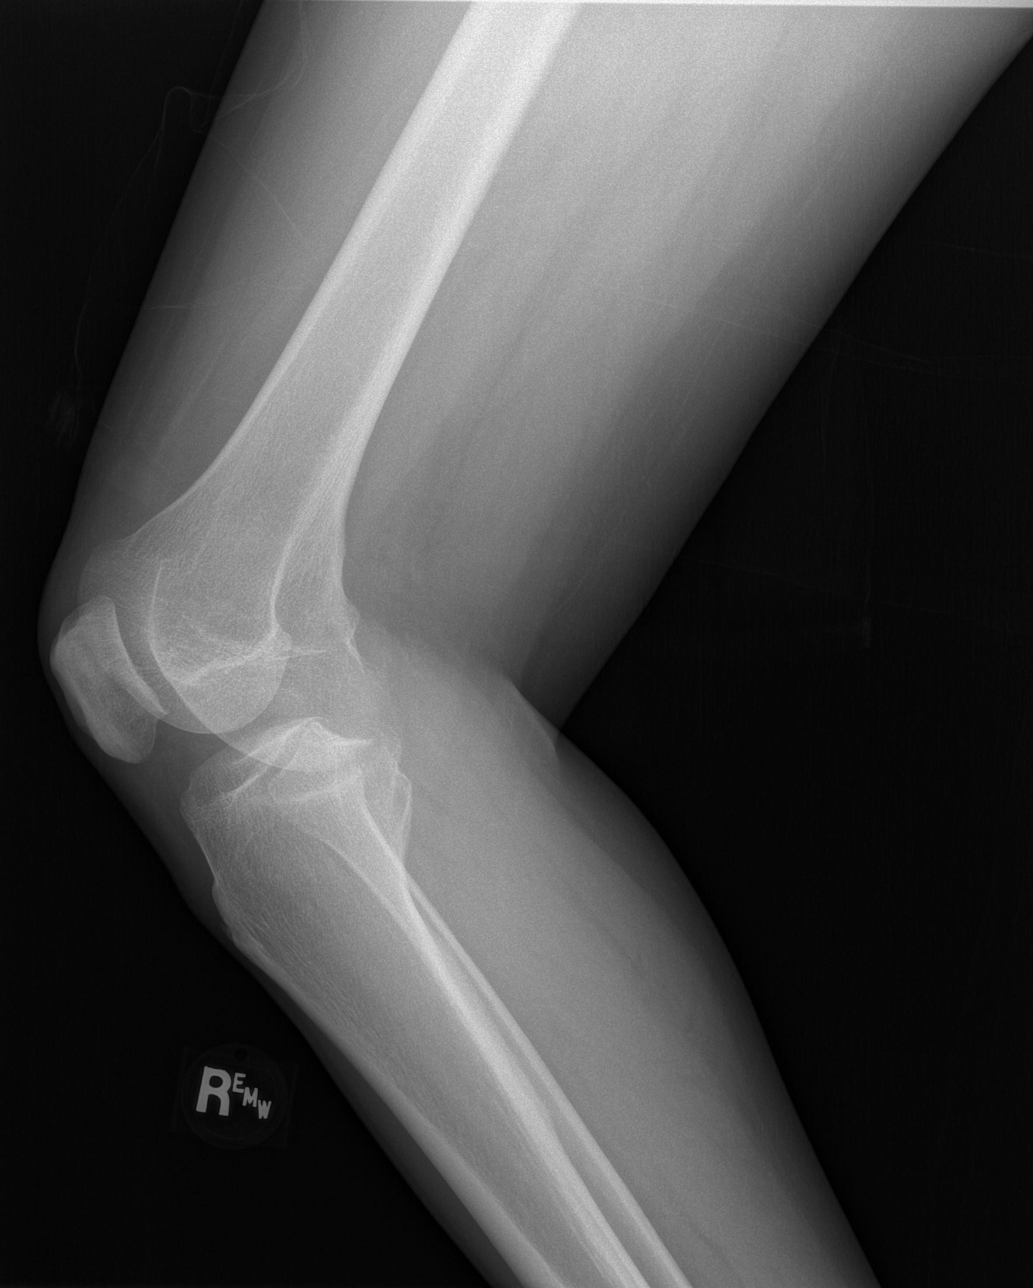

[4 of 4 positions shown; findings below may reference images not displayed]

FINDINGS: No evidence of fracture, dislocation, or joint effusion of the
bilateral knees. No evidence of arthropathy or other focal bone
abnormality. Soft tissues are unremarkable.
IMPRESSION: No acute osseous abnormality of the bilateral knees.

## 2021-11-17 ENCOUNTER — Telehealth: Payer: Self-pay | Admitting: Registered Nurse

## 2021-11-17 ENCOUNTER — Encounter: Payer: Self-pay | Admitting: Registered Nurse

## 2021-11-17 DIAGNOSIS — H1013 Acute atopic conjunctivitis, bilateral: Secondary | ICD-10-CM

## 2021-11-17 DIAGNOSIS — J302 Other seasonal allergic rhinitis: Secondary | ICD-10-CM

## 2021-11-17 MED ORDER — CARBOXYMETHYLCELLULOSE SOD PF 0.5 % OP SOLN: 1.0000 [drp] | Freq: Three times a day (TID) | OPHTHALMIC | 0 refills | Status: AC | PRN

## 2021-11-17 MED ORDER — LORATADINE 10 MG PO TABS: 10.0000 mg | ORAL_TABLET | Freq: Every day | ORAL | 0 refills | Status: DC

## 2021-11-17 NOTE — Telephone Encounter (Signed)
Patient had BP check with RN Ngozi and normal less than 130/90.  Feels vision is glossy when looking at lights/sunshine in the am and has noticed debris in the corners of her eyes.  Thinks it may be related to her losartan/hctz was increased to 25mg  and she thinks too strong for her.  Was not taking 12.5 every day but after told she had high blood pressure at Be Well appt started taking her medication daily and PCM changed her Rx higher dose.  Patient has been cutting her losartan tabs in 1/2 and taking 1/2 tab every day for the past 3 weeks.  Feeling well denied dizziness/headache/syncope/chest pain.  Discussed with patient blood pressure controlled today not high or low on our machine.  May return to clinic Thur/Fri for BP check if concerns regarding her blood pressure e.g. headache/dizziness/visual changes.  Discussed BP goal 110s/60s without dizziness.  Discussed uncontrolled high blood pressure can lead to vision problems.  Has new glasses/just had optometry visit 2 months ago.  Only wears classes for close up reading does not need for distance.  Symptoms occur with and without glasses.  Discussed if blurry vision/loss of visual field to seek same day evaluation with a provider.  Upon further discussion patient does have fall allergies.  Has not started her OTC antihistamine yet.  Discussed claritin 10mg  po daily recommended.  Given 2 UD from clinic stock and to purchase generic claritin/loratadine OTC.  Consider nasal saline 2 sprays each nostril prn congestion wa.  Patient works in at .    Given refresh drops UD x 2 from clinic stock apply 2 gtts ou BID prn dryness/itching/irritation.  Patient stated she will try refresh drops and antihistamine to see if vision symptoms resolve.  Discussed shower after work and prior to bed.  Patient verbalized understanding information/instructions, agreed with plan of care and had no further questions at this time.

## 2022-01-20 ENCOUNTER — Telehealth: Payer: Self-pay | Admitting: Registered Nurse

## 2022-01-20 ENCOUNTER — Encounter: Payer: Self-pay | Admitting: Registered Nurse

## 2022-01-20 DIAGNOSIS — D500 Iron deficiency anemia secondary to blood loss (chronic): Secondary | ICD-10-CM

## 2022-01-20 DIAGNOSIS — R739 Hyperglycemia, unspecified: Secondary | ICD-10-CM

## 2022-01-20 DIAGNOSIS — Z0000 Encounter for general adult medical examination without abnormal findings: Secondary | ICD-10-CM

## 2022-01-20 DIAGNOSIS — E559 Vitamin D deficiency, unspecified: Secondary | ICD-10-CM

## 2022-02-04 ENCOUNTER — Encounter: Payer: Self-pay | Admitting: Registered Nurse

## 2022-02-04 NOTE — Telephone Encounter (Signed)
Epic reviewed patient saw GYN 01/10/22 and was instructed to establish with PCM.  Spoke with patient via telephone she stated she started vitamin D supplement after talking with RN Hildred Alamin this summer but not taking every day misses some doses.  No labs performed at GYN per Epic.  Patient may see RN Vinnie Level today for nonfasting CBC, Vitamin D and Hgba1c.  Drink water prior to going to get lab draw.  If workload does not permit clinic appt today see RN Vinnie Level next week M-Th.  Notified patient [place] accepting new patients and near her home the office.  If trouble finding new PCM notiff [facility] staff for assistance.  Patient verbalized understanding information/instructions, agreed with plan of care and had no further questions at this time.

## 2022-04-06 ENCOUNTER — Other Ambulatory Visit: Payer: No Typology Code available for payment source | Admitting: Occupational Medicine

## 2022-04-06 ENCOUNTER — Telehealth: Payer: Self-pay | Admitting: Registered Nurse

## 2022-04-06 ENCOUNTER — Encounter: Payer: Self-pay | Admitting: Registered Nurse

## 2022-04-06 DIAGNOSIS — D500 Iron deficiency anemia secondary to blood loss (chronic): Secondary | ICD-10-CM

## 2022-04-06 DIAGNOSIS — R739 Hyperglycemia, unspecified: Secondary | ICD-10-CM

## 2022-04-06 DIAGNOSIS — E559 Vitamin D deficiency, unspecified: Secondary | ICD-10-CM

## 2022-04-06 NOTE — Telephone Encounter (Signed)
Overdue follow up labs epic reviewed and none noted done at other locations e.g. PCM/etc  RN Evlyn Kanner notified to schedule nonfasting

## 2022-04-06 NOTE — Progress Notes (Signed)
Lab drawn from Left [facility] tolerated well no issues noted.   

## 2022-04-07 ENCOUNTER — Encounter: Payer: Self-pay | Admitting: Registered Nurse

## 2022-04-07 LAB — HEMOGLOBIN A1C
Est. average glucose Bld gHb Est-mCnc: 117 mg/dL
Hgb A1c MFr Bld: 5.7 % — ABNORMAL HIGH (ref 4.8–5.6)

## 2022-04-07 LAB — CBC WITH DIFFERENTIAL/PLATELET
Basophils Absolute: 0 10*3/uL (ref 0.0–0.2)
Basos: 0 %
EOS (ABSOLUTE): 0.1 10*3/uL (ref 0.0–0.4)
Eos: 1 %
Hematocrit: 36.2 % (ref 34.0–46.6)
Hemoglobin: 11.5 g/dL (ref 11.1–15.9)
Immature Grans (Abs): 0 10*3/uL (ref 0.0–0.1)
Immature Granulocytes: 0 %
Lymphocytes Absolute: 1.5 10*3/uL (ref 0.7–3.1)
Lymphs: 33 %
MCH: 26.3 pg — ABNORMAL LOW (ref 26.6–33.0)
MCHC: 31.8 g/dL (ref 31.5–35.7)
MCV: 83 fL (ref 79–97)
Monocytes Absolute: 0.8 10*3/uL (ref 0.1–0.9)
Monocytes: 17 %
Neutrophils Absolute: 2.3 10*3/uL (ref 1.4–7.0)
Neutrophils: 49 %
Platelets: 361 10*3/uL (ref 150–450)
RBC: 4.37 x10E6/uL (ref 3.77–5.28)
RDW: 13.9 % (ref 11.7–15.4)
WBC: 4.7 10*3/uL (ref 3.4–10.8)

## 2022-04-07 LAB — VITAMIN D 25 HYDROXY (VIT D DEFICIENCY, FRACTURES): Vit D, 25-Hydroxy: 7.5 ng/mL — ABNORMAL LOW (ref 30.0–100.0)

## 2022-04-07 MED ORDER — CHOLECALCIFEROL 1.25 MG (50000 UT) PO TABS: 1.0000 | ORAL_TABLET | ORAL | 1 refills | Status: AC

## 2022-04-08 NOTE — Telephone Encounter (Signed)
Labs completed 04/05/22 anemia resolved.

## 2022-05-11 ENCOUNTER — Ambulatory Visit: Payer: No Typology Code available for payment source | Admitting: Occupational Medicine

## 2022-05-11 ENCOUNTER — Encounter: Payer: Self-pay | Admitting: Occupational Medicine

## 2022-05-11 DIAGNOSIS — M25511 Pain in right shoulder: Secondary | ICD-10-CM

## 2022-05-11 DIAGNOSIS — M25531 Pain in right wrist: Secondary | ICD-10-CM

## 2022-05-11 DIAGNOSIS — Z0000 Encounter for general adult medical examination without abnormal findings: Secondary | ICD-10-CM

## 2022-05-11 NOTE — Telephone Encounter (Signed)
Patient met 2/3 Be Well requirements 2025.  RN Kimrey to complete papework with patient.

## 2022-05-11 NOTE — Progress Notes (Signed)
Be well insurance premium discount evaluation:   Patient completed PCM office visit epic reviewed by RN Evlyn Kanner and transcribed. Labs  Tobacco attestation signed. [facility] ROI formed signed. Forms placed in the chart.   Patient given handouts for [facility] pharmacies and discount drugs list,MyChart, Tele doc setup, Tele doc Behavioral, [place] counseling and [facility] counseling.  What to do for infectious illness protocol. Given handout for list of medications that can be filled at [facility]. Given Clinic hours and Clinic Email.  Patient complains of right wrist pain and right shoulder pain from the repetition of job. Pain is 6/10.Right wrist applied wrist support brace. Right shoulder a [employer] joint. Educated if not helping to come back to clinic and will schedule appt with NP.

## 2022-07-05 NOTE — Telephone Encounter (Signed)
Be Well paperwork signed 05/11/22 on chart review paper at [facility].

## 2022-08-08 ENCOUNTER — Other Ambulatory Visit: Payer: No Typology Code available for payment source | Admitting: Occupational Medicine

## 2022-08-08 DIAGNOSIS — E559 Vitamin D deficiency, unspecified: Secondary | ICD-10-CM

## 2022-08-08 DIAGNOSIS — Z0000 Encounter for general adult medical examination without abnormal findings: Secondary | ICD-10-CM

## 2022-08-08 DIAGNOSIS — R739 Hyperglycemia, unspecified: Secondary | ICD-10-CM

## 2022-08-08 NOTE — Progress Notes (Signed)
Lab drawn from Left [facility] tolerated well no issues noted.   

## 2022-08-09 ENCOUNTER — Other Ambulatory Visit: Payer: Self-pay | Admitting: Registered Nurse

## 2022-08-09 ENCOUNTER — Encounter: Payer: Self-pay | Admitting: Registered Nurse

## 2022-08-09 DIAGNOSIS — E559 Vitamin D deficiency, unspecified: Secondary | ICD-10-CM

## 2022-08-09 DIAGNOSIS — R7303 Prediabetes: Secondary | ICD-10-CM

## 2022-08-09 DIAGNOSIS — D500 Iron deficiency anemia secondary to blood loss (chronic): Secondary | ICD-10-CM

## 2022-08-09 LAB — CMP12+LP+TP+TSH+6AC+CBC/D/PLT
ALT: 14 IU/L (ref 0–32)
AST: 24 IU/L (ref 0–40)
Albumin/Globulin Ratio: 1 — ABNORMAL LOW (ref 1.2–2.2)
Albumin: 3.8 g/dL — ABNORMAL LOW (ref 3.9–4.9)
Alkaline Phosphatase: 82 IU/L (ref 44–121)
BUN/Creatinine Ratio: 12 (ref 9–23)
BUN: 10 mg/dL (ref 6–24)
Basophils Absolute: 0 10*3/uL (ref 0.0–0.2)
Basos: 0 %
Bilirubin Total: 0.3 mg/dL (ref 0.0–1.2)
Calcium: 9.2 mg/dL (ref 8.7–10.2)
Chloride: 100 mmol/L (ref 96–106)
Chol/HDL Ratio: 3 ratio (ref 0.0–4.4)
Cholesterol, Total: 152 mg/dL (ref 100–199)
Creatinine, Ser: 0.85 mg/dL (ref 0.57–1.00)
EOS (ABSOLUTE): 0 10*3/uL (ref 0.0–0.4)
Eos: 0 %
Estimated CHD Risk: <0.5 times avg. (ref 0.0–1.0)
Free Thyroxine Index: 2 (ref 1.2–4.9)
GGT: 10 IU/L (ref 0–60)
Globulin, Total: 4 g/dL (ref 1.5–4.5)
Glucose: 88 mg/dL (ref 70–99)
HDL: 50 mg/dL (ref >39)
Hematocrit: 34.4 % (ref 34.0–46.6)
Hemoglobin: 10.8 g/dL — ABNORMAL LOW (ref 11.1–15.9)
Immature Grans (Abs): 0 10*3/uL (ref 0.0–0.1)
Immature Granulocytes: 0 %
Iron: 30 ug/dL (ref 27–159)
LDH: 174 IU/L (ref 119–226)
LDL Chol Calc (NIH): 82 mg/dL (ref 0–99)
Lymphocytes Absolute: 1.2 10*3/uL (ref 0.7–3.1)
Lymphs: 24 %
MCH: 26.9 pg (ref 26.6–33.0)
MCHC: 31.4 g/dL — ABNORMAL LOW (ref 31.5–35.7)
MCV: 86 fL (ref 79–97)
Monocytes Absolute: 0.6 10*3/uL (ref 0.1–0.9)
Monocytes: 13 %
Neutrophils Absolute: 3 10*3/uL (ref 1.4–7.0)
Neutrophils: 63 %
Phosphorus: 4.3 mg/dL (ref 3.0–4.3)
Platelets: 319 10*3/uL (ref 150–450)
Potassium: 4 mmol/L (ref 3.5–5.2)
RBC: 4.02 x10E6/uL (ref 3.77–5.28)
RDW: 14.5 % (ref 11.7–15.4)
Sodium: 138 mmol/L (ref 134–144)
T3 Uptake Ratio: 28 % (ref 24–39)
T4, Total: 7.3 ug/dL (ref 4.5–12.0)
TSH: 2.13 u[IU]/mL (ref 0.450–4.500)
Total Protein: 7.8 g/dL (ref 6.0–8.5)
Triglycerides: 111 mg/dL (ref 0–149)
Uric Acid: 5.4 mg/dL (ref 2.6–6.2)
VLDL Cholesterol Cal: 20 mg/dL (ref 5–40)
WBC: 4.9 10*3/uL (ref 3.4–10.8)
eGFR: 84 mL/min/{1.73_m2} (ref >59)

## 2022-08-09 LAB — VITAMIN D 25 HYDROXY (VIT D DEFICIENCY, FRACTURES): Vit D, 25-Hydroxy: 6.2 ng/mL — ABNORMAL LOW (ref 30.0–100.0)

## 2022-08-09 LAB — HGB A1C W/O EAG: Hgb A1c MFr Bld: 5.7 % — ABNORMAL HIGH (ref 4.8–5.6)

## 2022-08-09 MED ORDER — CHOLECALCIFEROL 1.25 MG (50000 UT) PO TABS: 1.0000 | ORAL_TABLET | ORAL | 2 refills | Status: AC

## 2022-08-09 NOTE — Progress Notes (Signed)
My chart message sent to patient Cassandra Yates, Your 3 month blood sugar slightly elevated.  Vitamin d albumin, and hemoglobin (anemia red blood cells) were low.   Any changes with menstrual bleeding?  Have you seen your GYN provider in the past year?  Any bleeding/bruising/nosebleeds/blood in urine or stool?   I recommend starting 50,000 units vitamin D3 by mouth weekly. Rx sent to [facility] Take vitamin D with meal that has fat in it weekly.   Try to get 15 minutes sun on skin daily also.   Recheck level in 6 months Vitamin D, Hgba1c and CBC schedule with RN Kimrey.  Avoid dehdyration, consider activity after meals e.g. walk, stretches, house chores.  [facility] handouts on prediabetes/prediabetes diet, anemia and vitamin D deficiency sent to my chart. I recommend appt with [facility] dietitian here is link to schedule  [facility] ([email or web address]).  Please see RN Bess Kinds if you want printed copy of results or results sent to another provider electronically.  Follow up with PCM elevated blood sugar and low red blood cells/vitamin d routine.   I recommend weight loss, exercise 150 minutes per week; 20 grams women per up to date; eat whole grains/fruits/vegetables; keep added sugars to less than 100 calories/ 5 teaspoons for women  per American Heart Association; spot blood sugar, cholesterol, electrolytes, iron, kidney/liver function, thyroid function normal.  No infection noted on complete blood count normal  See RN Kimrey to sign your Be Well insurance discount paperwork for 2025.  Please let us know if you have further questions or concerns.  Sincerely, Albina Billet NP-C

## 2022-08-16 ENCOUNTER — Ambulatory Visit: Payer: No Typology Code available for payment source | Admitting: Occupational Medicine

## 2022-08-16 VITALS — BP 120/80

## 2022-08-16 DIAGNOSIS — Z0000 Encounter for general adult medical examination without abnormal findings: Secondary | ICD-10-CM

## 2022-08-16 DIAGNOSIS — D500 Iron deficiency anemia secondary to blood loss (chronic): Secondary | ICD-10-CM

## 2022-08-16 DIAGNOSIS — E559 Vitamin D deficiency, unspecified: Secondary | ICD-10-CM

## 2022-08-16 NOTE — Progress Notes (Signed)
Be well insurance premium discount evaluation: Met  Epic reviewed by RN Kimrey and transcribed labs and reviewed with the patient. Scheduled follow up labs.  Tobacco attestation signed. [facility] ROI formed signed. Forms placed in the chart.   Patient given handouts for [facility] pharmacies and discount drugs list,MyChart, Tele doc setup, Tele doc Behavioral, [place] counseling and [facility] counseling.  What to do for infectious illness protocol. Given handout for list of medications that can be filled at [facility]. Given Clinic hours and Clinic Email.  

## 2022-09-18 NOTE — Progress Notes (Signed)
Noted patient has had follow up labs 

## 2022-10-29 NOTE — Progress Notes (Signed)
Patient met with RN Bess Kinds 08/16/22 discussed lab results/instructions and given printed copy.  Patient reviewed results electronically 08/12/22 per epic read receipt.  Next lab appt 11/09/22 with RN Bess Kinds.  Patient had follow up appt GYN 10/13/22 BP 120/80 weight 179lbs 10 lb loss since previous Be Well.  Provider signed Be Well 2025 paperwork 08/18/22 and RN Kimrey notified HR team patient met requirements for insurance discount FY 2025 Follow up vitamin D levels cancelled due to new national guidelines women in child bearing years to take 2000 units po daily with meal and no routine/annual levels to be drawn.  Patient to finish 50,000 units Rx then start otc RN Kimrey notified  supplement my chart message sent to patient

## 2022-11-09 ENCOUNTER — Other Ambulatory Visit: Payer: Self-pay | Admitting: Registered Nurse

## 2022-11-09 ENCOUNTER — Other Ambulatory Visit: Payer: No Typology Code available for payment source

## 2022-11-09 DIAGNOSIS — D500 Iron deficiency anemia secondary to blood loss (chronic): Secondary | ICD-10-CM

## 2022-11-09 DIAGNOSIS — R7303 Prediabetes: Secondary | ICD-10-CM

## 2022-11-09 NOTE — Progress Notes (Signed)
Labs drawn without difficulty.

## 2022-11-10 ENCOUNTER — Telehealth: Payer: Self-pay | Admitting: Registered Nurse

## 2022-11-10 ENCOUNTER — Encounter: Payer: Self-pay | Admitting: Registered Nurse

## 2022-11-10 DIAGNOSIS — D500 Iron deficiency anemia secondary to blood loss (chronic): Secondary | ICD-10-CM

## 2022-11-10 NOTE — Telephone Encounter (Addendum)
Labs drawn yesterday sent to [facility] did not cross over into epic  My chart message sent to patient  Cassandra Yates,  Your blood sugar now in normal range Congratulations!  Keep up your efforts as it is high normal to avoid dehydration, keep added sugars less than 25 grams per day 100 calories/5 teaspoons, activity after meals/snacks.  Next recheck with Be Well 2026 labs in 2025 recommended.  Your complete blood count still showing low red blood cells and worsening.  Were you able to discuss further with GYN and your PCM?  I recommend recheck in 3 months CBC.  Please let Rosalita Chessman know if you want printed copy of results or electronically sent to PCM/GYN provider.  Exitcare handout on anemia sent to your my chart also.  Please let us know if you have further questions.  Sincerely,  Albina Billet NP-C   WBC  6.5 x10E3/uL 3.4 - 10.8  RBC  3.64 Low x10E6/uL 3.77 - 5.28  Hemoglobin  9.9 Low g/dL 56.2 - 13.0  Hematocrit  31.4 Low % 34.0 - 46.6  MCV  86 fL 79 - 97  MCH  27.2 pg 26.6 - 33.0  MCHC  31.5 g/dL 86.5 - 78.4  RDW  69.6 % 11.7 - 15.4  Platelets  390 x10E3/uL 150 - 450  Neutrophils  65  %  Not Estab.  Lymphs  19  %  Not Estab.  Monocytes  14  %  Not Estab.  Eos  1  %  Not Estab.  Basos  0  %  Not Estab.  Neutrophils (Absolute)  4.2 x10E3/uL 1.4 - 7.0  Lymphs (Absolute)  1.2 x10E3/uL 0.7 - 3.1  Monocytes(Absolute)  0.9 x10E3/uL 0.1 - 0.9  Eos (Absolute)  0.1 x10E3/uL 0.0 - 0.4  Baso (Absolute)  0.0 x10E3/uL 0.0 - 0.2  Immature Granulocytes  1  %  Not Estab.  Immature Grans (Abs)  0.0 x10E3/uL 0.0 - 0.1 Hemoglobin A1c (#[id number])  Test Results Flag Units Reference Interval  Hemoglobin A1c  5.6 % 4.8 - 5.6  Please Note:                                                         .          Prediabetes: 5.7 - 6.4          Diabetes: >6.4          Glycemic control for adults with diabetes: <7.0  Hgba1c now normal and  CBC  WBC 3.4 - 10.8 x10E3/uL 4.9 4.7 5.3 4.6 4.6 R   RBC 3.77 - 5.28 x10E6/uL 4.02 4.37 4.05 CM 3.98 3.76 Low  R   Hemoglobin 11.1 - 15.9 g/dL 13.2 Low  44.0 10.2 Low  10.2 Low  9.7 Low  R   Hematocrit 34.0 - 46.6 % 34.4 36.2 33.3 Low  32.3 Low  30.4 Low  R   MCV 79 - 97 fL 86 83 82 81 80.9 R   MCH 26.6 - 33.0 pg 26.9 26.3 Low  26.7 25.6 Low  25.8 Low  R   MCHC 31.5 - 35.7 g/dL 72.5 Low  36.6 44.0 34.7 31.9 R   RDW 11.7 - 15.4 % 14.5 13.9 15.5 High  14.9 14.7 R   Platelets 150 - 450 x10E3/uL 319 361 339 364 448  High  R   Neutrophils Not Estab. % 63 49 42 62 59 R   Lymphs Not Estab. % 24 33 35 24    Monocytes Not Estab. % 13 17 21 13     Eos Not Estab. % 0 1 2 1     Basos Not Estab. % 0 0 0 0    Neutrophils Absolute 1.4 - 7.0 x10E3/uL 3.0 2.3 2.2 2.9 2.9 R   Lymphocytes Absolute 0.7 - 3.1 x10E3/uL 1.2 1.5 1.8 1.1 1.1 R   Monocytes Absolute 0.1 - 0.9 x10E3/uL 0.6 0.8 1.1 High  0.6    EOS (ABSOLUTE) 0.0 - 0.4 x10E3/uL 0.0 0.1 0.1 0.0    Basophils Absolute 0.0 - 0.2 x10E3/uL 0.0 0.0 0.0 0.0 0.0 R   Immature Granulocytes Not Estab. % 0 0 0 0 0 R   Immature Grans (Abs) 0.0 - 0.1 x10E3/uL 0.0 0.0 0.0 0.0    Resulting Agency [facility]

## 2023-01-10 NOTE — Telephone Encounter (Signed)
Patient announced resignation and will no longer be eligible for care [facility] started 18 Jan 2023 follow up with PCM/GYN

## 2023-01-11 NOTE — Telephone Encounter (Signed)
Noted patient has appt with GYN 01/12/23 scheduled

## 2023-01-18 ENCOUNTER — Other Ambulatory Visit: Payer: No Typology Code available for payment source

## 2023-01-19 ENCOUNTER — Other Ambulatory Visit: Payer: No Typology Code available for payment source
